# Patient Record
Sex: Female | Born: 1950 | Race: Black or African American | Hispanic: No | Marital: Single | Smoking: Current every day smoker
Health system: Southern US, Community
[De-identification: ages and names within clinical notes are randomized; demographics above are authoritative.]

## PROBLEM LIST (undated history)

## (undated) DIAGNOSIS — I10 Essential (primary) hypertension: Secondary | ICD-10-CM

## (undated) DIAGNOSIS — E7800 Pure hypercholesterolemia, unspecified: Secondary | ICD-10-CM

## (undated) DIAGNOSIS — J449 Chronic obstructive pulmonary disease, unspecified: Secondary | ICD-10-CM

## (undated) DIAGNOSIS — K219 Gastro-esophageal reflux disease without esophagitis: Secondary | ICD-10-CM

## (undated) DIAGNOSIS — I219 Acute myocardial infarction, unspecified: Secondary | ICD-10-CM

## (undated) DIAGNOSIS — M1990 Unspecified osteoarthritis, unspecified site: Secondary | ICD-10-CM

## (undated) DIAGNOSIS — I2510 Atherosclerotic heart disease of native coronary artery without angina pectoris: Secondary | ICD-10-CM

## (undated) HISTORY — PX: CARDIAC DEFIBRILLATOR PLACEMENT: SHX171

## (undated) HISTORY — PX: CARDIAC SURGERY: SHX584

## (undated) HISTORY — PX: FEMUR FRACTURE SURGERY: SHX633

## (undated) HISTORY — PX: CORONARY ARTERY BYPASS GRAFT: SHX141

## (undated) HISTORY — PX: ABDOMINAL HYSTERECTOMY: SHX81

## (undated) HISTORY — PX: VALVE REPLACEMENT: SUR13

---

## 2005-03-08 ENCOUNTER — Emergency Department (HOSPITAL_COMMUNITY): Admission: EM | Admit: 2005-03-08 | Discharge: 2005-03-08 | Payer: Self-pay | Admitting: Emergency Medicine

## 2005-11-22 ENCOUNTER — Emergency Department (HOSPITAL_COMMUNITY): Admission: EM | Admit: 2005-11-22 | Discharge: 2005-11-22 | Payer: Self-pay | Admitting: Emergency Medicine

## 2005-12-19 ENCOUNTER — Emergency Department (HOSPITAL_COMMUNITY): Admission: EM | Admit: 2005-12-19 | Discharge: 2005-12-19 | Payer: Self-pay | Admitting: Emergency Medicine

## 2006-01-08 ENCOUNTER — Emergency Department (HOSPITAL_COMMUNITY): Admission: EM | Admit: 2006-01-08 | Discharge: 2006-01-08 | Payer: Self-pay | Admitting: Emergency Medicine

## 2006-03-20 ENCOUNTER — Emergency Department (HOSPITAL_COMMUNITY): Admission: EM | Admit: 2006-03-20 | Discharge: 2006-03-20 | Payer: Self-pay | Admitting: Emergency Medicine

## 2011-10-24 ENCOUNTER — Emergency Department (HOSPITAL_COMMUNITY)
Admission: EM | Admit: 2011-10-24 | Discharge: 2011-10-24 | Disposition: A | Discharge status: Home / Self Care | Payer: Self-pay | Attending: Emergency Medicine | Admitting: Emergency Medicine

## 2011-10-24 ENCOUNTER — Encounter (HOSPITAL_COMMUNITY): Payer: Self-pay | Admitting: Emergency Medicine

## 2011-10-24 DIAGNOSIS — Z79899 Other long term (current) drug therapy: Secondary | ICD-10-CM | POA: Insufficient documentation

## 2011-10-24 DIAGNOSIS — I10 Essential (primary) hypertension: Secondary | ICD-10-CM | POA: Insufficient documentation

## 2011-10-24 DIAGNOSIS — I2510 Atherosclerotic heart disease of native coronary artery without angina pectoris: Secondary | ICD-10-CM | POA: Insufficient documentation

## 2011-10-24 DIAGNOSIS — K219 Gastro-esophageal reflux disease without esophagitis: Secondary | ICD-10-CM | POA: Insufficient documentation

## 2011-10-24 DIAGNOSIS — E7800 Pure hypercholesterolemia, unspecified: Secondary | ICD-10-CM | POA: Insufficient documentation

## 2011-10-24 DIAGNOSIS — F1110 Opioid abuse, uncomplicated: Secondary | ICD-10-CM

## 2011-10-24 DIAGNOSIS — Z7982 Long term (current) use of aspirin: Secondary | ICD-10-CM | POA: Insufficient documentation

## 2011-10-24 DIAGNOSIS — F1120 Opioid dependence, uncomplicated: Secondary | ICD-10-CM | POA: Insufficient documentation

## 2011-10-24 HISTORY — DX: Essential (primary) hypertension: I10

## 2011-10-24 HISTORY — DX: Pure hypercholesterolemia, unspecified: E78.00

## 2011-10-24 HISTORY — DX: Gastro-esophageal reflux disease without esophagitis: K21.9

## 2011-10-24 HISTORY — DX: Atherosclerotic heart disease of native coronary artery without angina pectoris: I25.10

## 2011-10-24 LAB — COMPREHENSIVE METABOLIC PANEL
BUN: 16 mg/dL (ref 6–23)
CO2: 24 mEq/L (ref 19–32)
Chloride: 105 mEq/L (ref 96–112)
Creatinine, Ser: 0.86 mg/dL (ref 0.50–1.10)
GFR calc Af Amer: 87 mL/min — ABNORMAL LOW (ref >90)
GFR calc non Af Amer: 75 mL/min — ABNORMAL LOW (ref >90)
Glucose, Bld: 85 mg/dL (ref 70–99)
Potassium: 3.8 mEq/L (ref 3.5–5.1)

## 2011-10-24 LAB — RAPID URINE DRUG SCREEN, HOSP PERFORMED
Barbiturates: NOT DETECTED
Cocaine: NOT DETECTED
Opiates: POSITIVE — AB
Tetrahydrocannabinol: NOT DETECTED

## 2011-10-24 LAB — CBC
HCT: 35.9 % — ABNORMAL LOW (ref 36.0–46.0)
MCV: 75.7 fL — ABNORMAL LOW (ref 78.0–100.0)
RDW: 16.8 % — ABNORMAL HIGH (ref 11.5–15.5)

## 2011-10-24 MED ORDER — PROMETHAZINE HCL 25 MG PO TABS: 25.0000 mg | ORAL_TABLET | Freq: Four times a day (QID) | ORAL | Status: AC | PRN

## 2011-10-24 MED ORDER — ZOLPIDEM TARTRATE 5 MG PO TABS: 5.0000 mg | ORAL_TABLET | Freq: Every evening | ORAL | Status: DC | PRN

## 2011-10-24 MED ORDER — CLONIDINE HCL 0.2 MG PO TABS: 0.1000 mg | ORAL_TABLET | Freq: Three times a day (TID) | ORAL | Status: AC | PRN

## 2011-10-24 NOTE — ED Notes (Signed)
Denies SI or HI 

## 2011-10-24 NOTE — ED Notes (Signed)
Patient waiting discharge papers. Patient resting with NAD at this time.

## 2011-10-24 NOTE — ED Notes (Signed)
Here for detox from pain killers last used this am

## 2011-10-24 NOTE — ED Provider Notes (Signed)
History     CSN: [id number]  Arrival date & time 10/24/11  1459   First MD Initiated Contact with Patient 10/24/11 1717      Chief Complaint  Patient presents with  . Medical Clearance    (Consider location/radiation/quality/duration/timing/severity/associated sxs/prior treatment) The history is provided by the patient.   the patient requests detox from her Vicodin.  She reports she's been using Vicodin for approximately one year now.  He was initially given to her for pain control.  She reports she takes about 4 Vicodin a day and she no longer likes using it.  She reports she tried to come off for one time but it made her feel achy and "miserable".  Patient denies homicidal or suicidal ideation.  Does request help with detox from her Vicodin.  Past Medical History  Diagnosis Date  . Coronary artery disease   . Hypertension   . High cholesterol   . Acid reflux     Past Surgical History  Procedure Date  . Cardiac surgery   . Coronary artery bypass graft     No family history on file.  History  Substance Use Topics  . Smoking status: Current Everyday Smoker  . Smokeless tobacco: Not on file  . Alcohol Use: No    OB History    Grav Para Term Preterm Abortions TAB SAB Ect Mult Living                  Review of Systems  All other systems reviewed and are negative.    Allergies  Codeine  Home Medications   Current Outpatient Rx  Name Route Sig Dispense Refill  . ASPIRIN EC 81 MG PO TBEC Oral Take 81 mg by mouth daily.    Marland Kitchen CARVEDILOL 12.5 MG PO TABS Oral Take 6.25-12.5 mg by mouth 2 (two) times daily with a meal. Patient takes 1 tablet in the morning and 1/2 tablet in the evening    . EZETIMIBE 10 MG PO TABS Oral Take 10 mg by mouth daily.    . OMEGA-3 FATTY ACIDS 1000 MG PO CAPS Oral Take 1 g by mouth daily.    . ISOSORBIDE MONONITRATE ER 30 MG PO TB24 Oral Take 30 mg by mouth daily.    . ADULT MULTIVITAMIN W/MINERALS CH Oral Take 1 tablet by mouth daily.      Marland Kitchen OLMESARTAN MEDOXOMIL-HCTZ 40-25 MG PO TABS Oral Take 1 tablet by mouth daily.    Marland Kitchen PRASUGREL HCL 10 MG PO TABS Oral Take 10 mg by mouth daily.    Marland Kitchen RABEPRAZOLE SODIUM 20 MG PO TBEC Oral Take 20 mg by mouth daily.    Marland Kitchen ROSUVASTATIN CALCIUM 20 MG PO TABS Oral Take 20 mg by mouth daily.      BP 173/88  Pulse 82  Temp(Src) 98.6 F (37 C) (Oral)  Resp 18  SpO2 99%  Physical Exam  Nursing note and vitals reviewed. Constitutional: She is oriented to person, place, and time. She appears well-developed and well-nourished. No distress.  HENT:  Head: Normocephalic and atraumatic.  Eyes: EOM are normal.  Neck: Normal range of motion.  Cardiovascular: Normal rate, regular rhythm and normal heart sounds.   Pulmonary/Chest: Effort normal and breath sounds normal.  Abdominal: Soft. She exhibits no distension. There is no tenderness.  Musculoskeletal: Normal range of motion.  Neurological: She is alert and oriented to person, place, and time.  Skin: Skin is warm and dry.  Psychiatric: She has a normal mood and  affect. Judgment normal.    ED Course  Procedures (including critical care time)  Labs Reviewed  CBC - Abnormal; Notable for the following:    WBC 12.4 (*)    HCT 35.9 (*)    MCV 75.7 (*)    MCH 25.3 (*)    RDW 16.8 (*)    All other components within normal limits  COMPREHENSIVE METABOLIC PANEL - Abnormal; Notable for the following:    Albumin 3.4 (*)    Total Bilirubin 0.2 (*)    GFR calc non Af Amer 75 (*)    GFR calc Af Amer 87 (*)    All other components within normal limits  URINE RAPID DRUG SCREEN (HOSP PERFORMED) - Abnormal; Notable for the following:    Opiates POSITIVE (*)    All other components within normal limits  ETHANOL   No results found.   1. Narcotic Abuse    MDM  She only uses four Vicodin daily. Will tx as outpatient with referrel to NA. Home with phenergan, clonidine and ambien.         Lyanne Co, MD 10/24/11 [phone]

## 2014-08-22 ENCOUNTER — Emergency Department (HOSPITAL_BASED_OUTPATIENT_CLINIC_OR_DEPARTMENT_OTHER)
Admission: EM | Admit: 2014-08-22 | Discharge: 2014-08-22 | Disposition: A | Discharge status: Home / Self Care | Payer: Medicaid Other | Attending: Emergency Medicine | Admitting: Emergency Medicine

## 2014-08-22 ENCOUNTER — Encounter (HOSPITAL_BASED_OUTPATIENT_CLINIC_OR_DEPARTMENT_OTHER): Payer: Self-pay | Admitting: *Deleted

## 2014-08-22 DIAGNOSIS — Z79899 Other long term (current) drug therapy: Secondary | ICD-10-CM | POA: Diagnosis not present

## 2014-08-22 DIAGNOSIS — Y998 Other external cause status: Secondary | ICD-10-CM | POA: Insufficient documentation

## 2014-08-22 DIAGNOSIS — Y9289 Other specified places as the place of occurrence of the external cause: Secondary | ICD-10-CM | POA: Insufficient documentation

## 2014-08-22 DIAGNOSIS — K219 Gastro-esophageal reflux disease without esophagitis: Secondary | ICD-10-CM | POA: Insufficient documentation

## 2014-08-22 DIAGNOSIS — S29012A Strain of muscle and tendon of back wall of thorax, initial encounter: Secondary | ICD-10-CM | POA: Insufficient documentation

## 2014-08-22 DIAGNOSIS — Z955 Presence of coronary angioplasty implant and graft: Secondary | ICD-10-CM | POA: Diagnosis not present

## 2014-08-22 DIAGNOSIS — Z7902 Long term (current) use of antithrombotics/antiplatelets: Secondary | ICD-10-CM | POA: Diagnosis not present

## 2014-08-22 DIAGNOSIS — S46819A Strain of other muscles, fascia and tendons at shoulder and upper arm level, unspecified arm, initial encounter: Secondary | ICD-10-CM

## 2014-08-22 DIAGNOSIS — Y9389 Activity, other specified: Secondary | ICD-10-CM | POA: Insufficient documentation

## 2014-08-22 DIAGNOSIS — E780 Pure hypercholesterolemia: Secondary | ICD-10-CM | POA: Insufficient documentation

## 2014-08-22 DIAGNOSIS — Z720 Tobacco use: Secondary | ICD-10-CM | POA: Insufficient documentation

## 2014-08-22 DIAGNOSIS — Z9889 Other specified postprocedural states: Secondary | ICD-10-CM | POA: Insufficient documentation

## 2014-08-22 DIAGNOSIS — Z7982 Long term (current) use of aspirin: Secondary | ICD-10-CM | POA: Diagnosis not present

## 2014-08-22 DIAGNOSIS — M1990 Unspecified osteoarthritis, unspecified site: Secondary | ICD-10-CM | POA: Insufficient documentation

## 2014-08-22 DIAGNOSIS — I10 Essential (primary) hypertension: Secondary | ICD-10-CM | POA: Insufficient documentation

## 2014-08-22 DIAGNOSIS — I2510 Atherosclerotic heart disease of native coronary artery without angina pectoris: Secondary | ICD-10-CM | POA: Insufficient documentation

## 2014-08-22 DIAGNOSIS — W109XXA Fall (on) (from) unspecified stairs and steps, initial encounter: Secondary | ICD-10-CM | POA: Insufficient documentation

## 2014-08-22 DIAGNOSIS — M546 Pain in thoracic spine: Secondary | ICD-10-CM | POA: Diagnosis present

## 2014-08-22 DIAGNOSIS — Z87828 Personal history of other (healed) physical injury and trauma: Secondary | ICD-10-CM | POA: Diagnosis not present

## 2014-08-22 HISTORY — DX: Acute myocardial infarction, unspecified: I21.9

## 2014-08-22 HISTORY — DX: Unspecified osteoarthritis, unspecified site: M19.90

## 2014-08-22 MED ORDER — CYCLOBENZAPRINE HCL 10 MG PO TABS: 10.0000 mg | ORAL_TABLET | Freq: Two times a day (BID) | ORAL | Status: DC | PRN

## 2014-08-22 MED ORDER — HYDROCODONE-ACETAMINOPHEN 5-325 MG PO TABS: 1.0000 | ORAL_TABLET | Freq: Four times a day (QID) | ORAL | Status: DC | PRN

## 2014-08-22 NOTE — ED Provider Notes (Signed)
CSN: [id number]     Arrival date & time 08/22/14  1011 History   First MD Initiated Contact with Patient 08/22/14 1058     Chief Complaint  Patient presents with  . Back Pain     (Consider location/radiation/quality/duration/timing/severity/associated sxs/prior Treatment) Patient is a 63 y.o. female presenting with back pain. The history is provided by the patient.  Back Pain Location:  Thoracic spine Quality:  Aching and cramping Radiates to:  Does not radiate Pain severity:  Moderate Onset quality:  Gradual Duration:  2 days Timing:  Constant Progression:  Worsening Chronicity:  New Context: falling   Context comment:  She was coming out of her house and slipped on the steps and broke her fall by grabbing a handrail. Since that time she has developed pain in the bilateral trapezius muscles and pain in her triceps Relieved by:  Nothing Worsened by:  Movement Ineffective treatments:  Ibuprofen Associated symptoms: no abdominal pain, no bladder incontinence, no bowel incontinence, no chest pain, no headaches, no leg pain, no numbness, no paresthesias and no weakness   Risk factors: no hx of osteoporosis     Past Medical History  Diagnosis Date  . Coronary artery disease   . Hypertension   . High cholesterol   . Acid reflux   . Heart attack   . Arthritis    Past Surgical History  Procedure Laterality Date  . Cardiac surgery    . Coronary artery bypass graft    . Abdominal hysterectomy    . Femur fracture surgery     No family history on file. History  Substance Use Topics  . Smoking status: Current Every Day Smoker    Types: Cigarettes  . Smokeless tobacco: Never Used  . Alcohol Use: No   OB History    No data available     Review of Systems  Cardiovascular: Negative for chest pain.  Gastrointestinal: Negative for abdominal pain and bowel incontinence.  Genitourinary: Negative for bladder incontinence.  Musculoskeletal: Positive for back pain.    Neurological: Negative for weakness, numbness, headaches and paresthesias.  All other systems reviewed and are negative.     Allergies  Codeine  Home Medications   Prior to Admission medications   Medication Sig Start Date End Date Taking? Authorizing Provider  aspirin EC 81 MG tablet Take 81 mg by mouth daily.   Yes Historical Provider, MD  atorvastatin (LIPITOR) 80 MG tablet Take 80 mg by mouth daily.   Yes Historical Provider, MD  carvedilol (COREG) 12.5 MG tablet Take 6.25-12.5 mg by mouth 2 (two) times daily with a meal. Patient takes 1 tablet in the morning and 1/2 tablet in the evening   Yes Historical Provider, MD  fish oil-omega-3 fatty acids 1000 MG capsule Take 1 g by mouth daily.   Yes Historical Provider, MD  Multiple Vitamin (MULITIVITAMIN WITH MINERALS) TABS Take 1 tablet by mouth daily.   Yes Historical Provider, MD  pantoprazole (PROTONIX) 20 MG tablet Take 20 mg by mouth daily.   Yes Historical Provider, MD  cloNIDine (CATAPRES) 0.2 MG tablet Take 0.5 tablets (0.1 mg total) by mouth 3 (three) times daily as needed (withdrawl symptoms). 10/24/11 10/23/12  Lyanne CoKevin M Campos, MD  cyclobenzaprine (FLEXERIL) 10 MG tablet Take 1 tablet (10 mg total) by mouth 2 (two) times daily as needed for muscle spasms. 08/22/14   Gwyneth SproutWhitney Leeona Mccardle, MD  ezetimibe (ZETIA) 10 MG tablet Take 10 mg by mouth daily.    Historical Provider, MD  HYDROcodone-acetaminophen (NORCO/VICODIN) 5-325 MG per tablet Take 1-2 tablets by mouth every 6 (six) hours as needed for moderate pain or severe pain. 08/22/14   Gwyneth SproutWhitney Ramal Eckhardt, MD  isosorbide mononitrate (IMDUR) 30 MG 24 hr tablet Take 30 mg by mouth daily.    Historical Provider, MD  olmesartan-hydrochlorothiazide (BENICAR HCT) 40-25 MG per tablet Take 1 tablet by mouth daily.    Historical Provider, MD  prasugrel (EFFIENT) 10 MG TABS Take 10 mg by mouth daily.    Historical Provider, MD  RABEprazole (ACIPHEX) 20 MG tablet Take 20 mg by mouth daily.     Historical Provider, MD  rosuvastatin (CRESTOR) 20 MG tablet Take 20 mg by mouth daily.    Historical Provider, MD  zolpidem (AMBIEN) 5 MG tablet Take 1 tablet (5 mg total) by mouth at bedtime as needed for sleep. 10/24/11 10/23/12  Lyanne CoKevin M Campos, MD   BP 151/77 mmHg  Pulse 68  Temp(Src) 98 F (36.7 C) (Oral)  Resp 14  Ht 5\' 5"  (1.651 m)  Wt 147 lb (66.679 kg)  BMI 24.46 kg/m2  SpO2 99% Physical Exam  Constitutional: She is oriented to person, place, and time. She appears well-developed and well-nourished. No distress.  HENT:  Head: Normocephalic and atraumatic.  Cardiovascular: Normal rate and intact distal pulses.   Pulmonary/Chest: Effort normal.  Musculoskeletal:       Cervical back: She exhibits tenderness, pain and spasm.       Back:  Bilateral trapezius spasm  Neurological: She is alert and oriented to person, place, and time.  Skin: Skin is warm and dry.  Psychiatric: She has a normal mood and affect. Her behavior is normal.  Nursing note and vitals reviewed.   ED Course  Procedures (including critical care time) Labs Review Labs Reviewed - No data to display  Imaging Review No results found.   EKG Interpretation None      MDM   Final diagnoses:  Trapezius strain, unspecified laterality, initial encounter    Patient with evidence of trapezial spasm and strain after she fell 2 days ago. She grabbed the rail which jerked her arms to stop her fall. Which is most likely why she's having this pain. She is neurovascularly intact she has no midline tenderness requiring x-rays and she is otherwise able to ambulate without difficulty. Patient given symptomatic care and follow-up with PCP    Gwyneth SproutWhitney Harlan Vinal, MD 08/22/14 1118

## 2014-08-22 NOTE — ED Notes (Signed)
rx x 2 given for flexeril and vicodin- d/c home with ride

## 2014-08-22 NOTE — ED Notes (Signed)
States she lost balance on Thursday while going down 2 steps and landed on her bottom- grabbed rail to control fall- c/o back pain and back of both arms is sore-

## 2015-09-18 ENCOUNTER — Encounter (HOSPITAL_BASED_OUTPATIENT_CLINIC_OR_DEPARTMENT_OTHER): Payer: Self-pay | Admitting: Emergency Medicine

## 2015-09-18 ENCOUNTER — Emergency Department (HOSPITAL_BASED_OUTPATIENT_CLINIC_OR_DEPARTMENT_OTHER)
Admission: EM | Admit: 2015-09-18 | Discharge: 2015-09-18 | Disposition: A | Discharge status: Home / Self Care | Payer: Medicare Other | Attending: Emergency Medicine | Admitting: Emergency Medicine

## 2015-09-18 ENCOUNTER — Emergency Department (HOSPITAL_BASED_OUTPATIENT_CLINIC_OR_DEPARTMENT_OTHER): Payer: Medicare Other

## 2015-09-18 DIAGNOSIS — Z7982 Long term (current) use of aspirin: Secondary | ICD-10-CM | POA: Diagnosis not present

## 2015-09-18 DIAGNOSIS — E7800 Pure hypercholesterolemia, unspecified: Secondary | ICD-10-CM | POA: Diagnosis not present

## 2015-09-18 DIAGNOSIS — Z79899 Other long term (current) drug therapy: Secondary | ICD-10-CM | POA: Insufficient documentation

## 2015-09-18 DIAGNOSIS — Z951 Presence of aortocoronary bypass graft: Secondary | ICD-10-CM | POA: Diagnosis not present

## 2015-09-18 DIAGNOSIS — Z7901 Long term (current) use of anticoagulants: Secondary | ICD-10-CM | POA: Diagnosis not present

## 2015-09-18 DIAGNOSIS — K219 Gastro-esophageal reflux disease without esophagitis: Secondary | ICD-10-CM | POA: Insufficient documentation

## 2015-09-18 DIAGNOSIS — I2510 Atherosclerotic heart disease of native coronary artery without angina pectoris: Secondary | ICD-10-CM | POA: Diagnosis not present

## 2015-09-18 DIAGNOSIS — J40 Bronchitis, not specified as acute or chronic: Secondary | ICD-10-CM

## 2015-09-18 DIAGNOSIS — I509 Heart failure, unspecified: Secondary | ICD-10-CM | POA: Diagnosis not present

## 2015-09-18 DIAGNOSIS — I10 Essential (primary) hypertension: Secondary | ICD-10-CM | POA: Diagnosis not present

## 2015-09-18 DIAGNOSIS — I252 Old myocardial infarction: Secondary | ICD-10-CM | POA: Insufficient documentation

## 2015-09-18 DIAGNOSIS — R059 Cough, unspecified: Secondary | ICD-10-CM

## 2015-09-18 DIAGNOSIS — F17210 Nicotine dependence, cigarettes, uncomplicated: Secondary | ICD-10-CM | POA: Diagnosis not present

## 2015-09-18 DIAGNOSIS — M1990 Unspecified osteoarthritis, unspecified site: Secondary | ICD-10-CM | POA: Diagnosis not present

## 2015-09-18 DIAGNOSIS — J209 Acute bronchitis, unspecified: Secondary | ICD-10-CM | POA: Diagnosis not present

## 2015-09-18 DIAGNOSIS — R05 Cough: Secondary | ICD-10-CM

## 2015-09-18 LAB — PROTIME-INR
INR: 1.95 — ABNORMAL HIGH (ref 0.00–1.49)
Prothrombin Time: 22.1 seconds — ABNORMAL HIGH (ref 11.6–15.2)

## 2015-09-18 MED ORDER — AMOXICILLIN-POT CLAVULANATE 875-125 MG PO TABS: 1.0000 | ORAL_TABLET | Freq: Two times a day (BID) | ORAL | Status: DC

## 2015-09-18 MED ORDER — FUROSEMIDE 10 MG/ML IJ SOLN
40.0000 mg | Freq: Once | INTRAMUSCULAR | Status: AC
  Administered 2015-09-18: 40 mg via INTRAMUSCULAR
  Filled 2015-09-18: qty 4

## 2015-09-18 NOTE — ED Provider Notes (Signed)
CSN: [id number]     Arrival date & time 09/18/15  1631 History   By signing my name below, I, Terrance Branch, attest that this documentation has been prepared under the direction and in the presence of Rolan Bucco, MD. Electronically Signed: Evon Slack, ED Scribe. 09/18/2015. 7:08 PM.    Chief Complaint  Patient presents with  . Cough   The history is provided by the patient. No language interpreter was used.   HPI Comments: Lisa Arias is a 64 y.o. female who presents to the Emergency Department complaining of productive cough onset 2 weeks prior. Pt states that she is coughing up yellow sputum. Pt reports congestion and rhinorrhea. She doesn't report any medications PTA. Pt doesn't report any alleviating factors. Pt denies fever, n/v, CP or leg swelling. Pt does report that she is on an anticoagulant(coumadin). She does have some mild shortness of breath with coughing only. She has no exertional shortness of breath. There is no orthopnea. She denies any chest pain or tightness.   Past Medical History  Diagnosis Date  . Coronary artery disease   . Hypertension   . High cholesterol   . Acid reflux   . Heart attack (HCC)   . Arthritis    Past Surgical History  Procedure Laterality Date  . Cardiac surgery    . Coronary artery bypass graft    . Abdominal hysterectomy    . Femur fracture surgery     History reviewed. No pertinent family history. Social History  Substance Use Topics  . Smoking status: Current Every Day Smoker    Types: Cigarettes  . Smokeless tobacco: Never Used  . Alcohol Use: No   OB History    No data available      Review of Systems  Constitutional: Negative for fever, chills, diaphoresis and fatigue.  HENT: Positive for congestion. Negative for rhinorrhea and sneezing.   Eyes: Negative.   Respiratory: Positive for cough. Negative for chest tightness and shortness of breath.   Cardiovascular: Negative for chest pain and leg swelling.   Gastrointestinal: Negative for nausea, vomiting, abdominal pain, diarrhea and blood in stool.  Genitourinary: Negative for frequency, hematuria, flank pain and difficulty urinating.  Musculoskeletal: Negative for back pain and arthralgias.  Skin: Negative for rash.  Neurological: Negative for dizziness, speech difficulty, weakness, numbness and headaches.      Allergies  Codeine  Home Medications   Prior to Admission medications   Medication Sig Start Date End Date Taking? Authorizing Provider  warfarin (COUMADIN) 10 MG tablet Take 10 mg by mouth daily. tue and sat 10 mg, other days 5 mg   Yes Historical Provider, MD  amoxicillin-clavulanate (AUGMENTIN) 875-125 MG tablet Take 1 tablet by mouth 2 (two) times daily. One po bid x 7 days 09/18/15   Rolan Bucco, MD  aspirin EC 81 MG tablet Take 81 mg by mouth daily.    Historical Provider, MD  atorvastatin (LIPITOR) 80 MG tablet Take 80 mg by mouth daily.    Historical Provider, MD  carvedilol (COREG) 12.5 MG tablet Take 6.25-12.5 mg by mouth 2 (two) times daily with a meal. Patient takes 1 tablet in the morning and 1/2 tablet in the evening    Historical Provider, MD  cloNIDine (CATAPRES) 0.2 MG tablet Take 0.5 tablets (0.1 mg total) by mouth 3 (three) times daily as needed (withdrawl symptoms). 10/24/11 10/23/12  Azalia Bilis, MD  cyclobenzaprine (FLEXERIL) 10 MG tablet Take 1 tablet (10 mg total) by mouth 2 (two) times  daily as needed for muscle spasms. 08/22/14   Gwyneth Sprout, MD  ezetimibe (ZETIA) 10 MG tablet Take 10 mg by mouth daily.    Historical Provider, MD  fish oil-omega-3 fatty acids 1000 MG capsule Take 1 g by mouth daily.    Historical Provider, MD  HYDROcodone-acetaminophen (NORCO/VICODIN) 5-325 MG per tablet Take 1-2 tablets by mouth every 6 (six) hours as needed for moderate pain or severe pain. 08/22/14   Gwyneth Sprout, MD  isosorbide mononitrate (IMDUR) 30 MG 24 hr tablet Take 30 mg by mouth daily.    Historical  Provider, MD  Multiple Vitamin (MULITIVITAMIN WITH MINERALS) TABS Take 1 tablet by mouth daily.    Historical Provider, MD  olmesartan-hydrochlorothiazide (BENICAR HCT) 40-25 MG per tablet Take 1 tablet by mouth daily.    Historical Provider, MD  pantoprazole (PROTONIX) 20 MG tablet Take 20 mg by mouth daily.    Historical Provider, MD  prasugrel (EFFIENT) 10 MG TABS Take 10 mg by mouth daily.    Historical Provider, MD  RABEprazole (ACIPHEX) 20 MG tablet Take 20 mg by mouth daily.    Historical Provider, MD  rosuvastatin (CRESTOR) 20 MG tablet Take 20 mg by mouth daily.    Historical Provider, MD  zolpidem (AMBIEN) 5 MG tablet Take 1 tablet (5 mg total) by mouth at bedtime as needed for sleep. 10/24/11 10/23/12  Azalia Bilis, MD   BP 136/61 mmHg  Pulse 64  Temp(Src) 98.4 F (36.9 C) (Oral)  Resp 24  Ht  (1.651 m)  Wt 164 lb (74.39 kg)  BMI 27.29 kg/m2  SpO2 93%   Physical Exam  Constitutional: She is oriented to person, place, and time. She appears well-developed and well-nourished.  HENT:  Head: Normocephalic and atraumatic.  Eyes: Pupils are equal, round, and reactive to light.  Neck: Normal range of motion. Neck supple.  Cardiovascular: Normal rate, regular rhythm and normal heart sounds.   Click with mechanical heart valve.   Pulmonary/Chest: Effort normal and breath sounds normal. No respiratory distress. She has no wheezes. She has no rales. She exhibits no tenderness.  Abdominal: Soft. Bowel sounds are normal. There is no tenderness. There is no rebound and no guarding.  Musculoskeletal: Normal range of motion. She exhibits edema.  Trace edema bilaterally.   Lymphadenopathy:    She has no cervical adenopathy.  Neurological: She is alert and oriented to person, place, and time.  Skin: Skin is warm and dry. No rash noted.  Psychiatric: She has a normal mood and affect.    ED Course  Procedures (including critical care time) DIAGNOSTIC STUDIES: Oxygen Saturation is  93% on RA, adequate by my interpretation.    COORDINATION OF CARE: 6:02 PM-Discussed treatment plan with pt at bedside and pt agreed to plan.     Labs Review Labs Reviewed  PROTIME-INR - Abnormal; Notable for the following:    Prothrombin Time 22.1 (*)    INR 1.95 (*)    All other components within normal limits    Imaging Review Dg Chest 2 View  09/18/2015  CLINICAL DATA:  Cough, congestion for 2 weeks, productive cough. History of heart valve surgery and stent placement. EXAM: CHEST  2 VIEW COMPARISON:  None. FINDINGS: There is mild cardiomegaly. Median sternotomy wires appear intact and normally aligned. Valve replacement hardware in place. There is evidence of mild central pulmonary vascular congestion and mild bilateral interstitial edema. No overt alveolar pulmonary edema. Small pleural effusion versus atelectasis at the left lung base.  No large pleural effusion. No pneumothorax seen. No acute osseous abnormality. IMPRESSION: Mild cardiomegaly with central pulmonary vascular congestion and mild interstitial edema suggesting mild CHF/volume overload. No overt alveolar pulmonary edema seen. Probable small left pleural effusion and/or atelectasis. No evidence of pneumonia seen. Electronically Signed   By: Bary RichardStan  Maynard M.D.   On: 09/18/2015 17:33      EKG Interpretation None      MDM   Final diagnoses:  Bronchitis   Patient presents with cough productive of yellow sputum. It's been going on for 2 weeks. She states she initially thought it was getting a little bit better but over the last week it's been progressively getting worse. She's afebrile. However given the fact this been going on for 2 weeks and worsening, I did feel that she needs to be treated with antibiotics for possible bacterial bronchitis. Her chest x-ray does not demonstrate pneumonia. She has some mild CHF.  She does have a history of CHF and is on torosemide.  She doesn't have any clinical symptoms of worsening  CHF. She has no exertional shortness of breath. No chest pain. No increased leg swelling. However given her x-ray findings, I did give her a dose of Lasix here in the ED. I don't feel that at this point she needs further cardiac evaluation in the ED. Her current symptoms sound more consistent with a bronchitis. She is maintaining normal oxygen saturations. She was discharged home in good condition. She was started on Augmentin, as this seems to have less effect than other antibiotics on her INR. Her INR is a little subtherapeutic currently. She has an appointment this week to have her INR rechecked. She states it was a little bit too high last week and she had missed a couple doses. She was advised to follow-up with her family physician within the next few days or return here as needed for any worsening symptoms.   I personally performed the services described in this documentation, which was scribed in my presence.  The recorded information has been reviewed and considered.      Rolan BuccoMelanie Biridiana Twardowski, MD 09/18/15 [phone]

## 2015-09-18 NOTE — ED Notes (Signed)
Patient states that she has the "guck". Coughing up yellow thick mucus. The patient states that is had been going on for 2 weeks.

## 2015-09-18 NOTE — Discharge Instructions (Signed)

## 2017-07-07 ENCOUNTER — Emergency Department (HOSPITAL_BASED_OUTPATIENT_CLINIC_OR_DEPARTMENT_OTHER): Payer: Medicare Other

## 2017-07-07 ENCOUNTER — Encounter (HOSPITAL_BASED_OUTPATIENT_CLINIC_OR_DEPARTMENT_OTHER): Payer: Self-pay | Admitting: *Deleted

## 2017-07-07 ENCOUNTER — Emergency Department (HOSPITAL_BASED_OUTPATIENT_CLINIC_OR_DEPARTMENT_OTHER)
Admission: EM | Admit: 2017-07-07 | Discharge: 2017-07-07 | Disposition: A | Discharge status: Home / Self Care | Payer: Medicare Other | Attending: Emergency Medicine | Admitting: Emergency Medicine

## 2017-07-07 DIAGNOSIS — I509 Heart failure, unspecified: Secondary | ICD-10-CM | POA: Diagnosis not present

## 2017-07-07 DIAGNOSIS — I110 Hypertensive heart disease with heart failure: Secondary | ICD-10-CM | POA: Insufficient documentation

## 2017-07-07 DIAGNOSIS — J449 Chronic obstructive pulmonary disease, unspecified: Secondary | ICD-10-CM | POA: Insufficient documentation

## 2017-07-07 DIAGNOSIS — I2510 Atherosclerotic heart disease of native coronary artery without angina pectoris: Secondary | ICD-10-CM | POA: Insufficient documentation

## 2017-07-07 DIAGNOSIS — Z951 Presence of aortocoronary bypass graft: Secondary | ICD-10-CM | POA: Insufficient documentation

## 2017-07-07 DIAGNOSIS — Z7901 Long term (current) use of anticoagulants: Secondary | ICD-10-CM | POA: Insufficient documentation

## 2017-07-07 DIAGNOSIS — F17210 Nicotine dependence, cigarettes, uncomplicated: Secondary | ICD-10-CM | POA: Insufficient documentation

## 2017-07-07 DIAGNOSIS — Z95810 Presence of automatic (implantable) cardiac defibrillator: Secondary | ICD-10-CM | POA: Diagnosis not present

## 2017-07-07 DIAGNOSIS — R059 Cough, unspecified: Secondary | ICD-10-CM

## 2017-07-07 DIAGNOSIS — Z7982 Long term (current) use of aspirin: Secondary | ICD-10-CM | POA: Diagnosis not present

## 2017-07-07 DIAGNOSIS — R05 Cough: Secondary | ICD-10-CM | POA: Diagnosis present

## 2017-07-07 DIAGNOSIS — Z79899 Other long term (current) drug therapy: Secondary | ICD-10-CM | POA: Insufficient documentation

## 2017-07-07 HISTORY — DX: Chronic obstructive pulmonary disease, unspecified: J44.9

## 2017-07-07 MED ORDER — DEXTROMETHORPHAN-GUAIFENESIN 5-100 MG/5ML PO LIQD: 10.0000 mL | Freq: Every evening | ORAL | 0 refills | Status: DC | PRN

## 2017-07-07 MED ORDER — FUROSEMIDE 40 MG PO TABS: 40.0000 mg | ORAL_TABLET | Freq: Once | ORAL | Status: DC

## 2017-07-07 MED ORDER — FLUTICASONE PROPIONATE 50 MCG/ACT NA SUSP: 1.0000 | Freq: Every day | NASAL | 0 refills | Status: DC

## 2017-07-07 NOTE — ED Provider Notes (Signed)
[facility] Provider Note   CSN: [id number] Arrival date & time: 07/07/17  1624     History   Chief Complaint Chief Complaint  Patient presents with  . Cough    HPI Lisa Arias is a 66 y.o. female presenting with 10 day history of cough.  Patient with a past medical history including COPD and CHF presenting with 10 day history of cough. Cough is productive with clear phlegm. It is worse at night. She reports she is unable to sleep due to her cough. She had associated maxillary sinus pressure, but this has resolved. She denies nasal congestion, eye pain, or ear pain. She denies fevers, chills, sore throat, chest pain, worsening shortness of breath, nausea, vomiting, abdominal pain. She reports a friend with similar symptoms. She has not been taking anything to help with her symptoms. She is still on blood thinners, taking it daily. She does not have an inhaler or nebulizer. She denies wheezing. She is on oxygen, 2 L at baseline. She has not had to increase her oxygen recently.   HPI  Past Medical History:  Diagnosis Date  . Acid reflux   . Arthritis   . COPD (chronic obstructive pulmonary disease) (HCC)   . Coronary artery disease   . Heart attack (HCC)   . High cholesterol   . Hypertension     There are no active problems to display for this patient.   Past Surgical History:  Procedure Laterality Date  . ABDOMINAL HYSTERECTOMY    . CARDIAC DEFIBRILLATOR PLACEMENT    . CARDIAC SURGERY    . CORONARY ARTERY BYPASS GRAFT    . FEMUR FRACTURE SURGERY    . VALVE REPLACEMENT      OB History    No data available       Home Medications    Prior to Admission medications   Medication Sig Start Date End Date Taking? Authorizing Provider  amiodarone (PACERONE) 200 MG tablet Take 200 mg by mouth daily.   Yes [provider]  fish oil-omega-3 fatty acids 1000 MG capsule Take 1 g by mouth daily.   Yes [provider]  folic acid (FOLVITE) 1 MG  tablet Take 1 mg by mouth daily.   Yes [provider]  Multiple Vitamin (MULITIVITAMIN WITH MINERALS) TABS Take 1 tablet by mouth daily.   Yes [provider]  pantoprazole (PROTONIX) 20 MG tablet Take 20 mg by mouth daily.   Yes [provider]  pravastatin (PRAVACHOL) 40 MG tablet Take 40 mg by mouth daily.   Yes [provider]  warfarin (COUMADIN) 6 MG tablet Take 6 mg by mouth daily.   Yes [provider]  amoxicillin-clavulanate (AUGMENTIN) 875-125 MG tablet Take 1 tablet by mouth 2 (two) times daily. One po bid x 7 days 09/18/15   Rolan Bucco, MD  aspirin EC 81 MG tablet Take 81 mg by mouth daily.    [provider]  atorvastatin (LIPITOR) 80 MG tablet Take 80 mg by mouth daily.    [provider]  carvedilol (COREG) 12.5 MG tablet Take 6.25-12.5 mg by mouth 2 (two) times daily with a meal. Patient takes 1 tablet in the morning and 1/2 tablet in the evening    [provider]  cloNIDine (CATAPRES) 0.2 MG tablet Take 0.5 tablets (0.1 mg total) by mouth 3 (three) times daily as needed (withdrawl symptoms). 10/24/11 10/23/12  Azalia Bilis, MD  cyclobenzaprine (FLEXERIL) 10 MG tablet Take 1 tablet (10 mg  total) by mouth 2 (two) times daily as needed for muscle spasms. 08/22/14   Gwyneth Sprout, MD  Dextromethorphan-Guaifenesin 5-100 MG/5ML LIQD Take 10 mLs by mouth at bedtime as needed. 07/07/17   Nayara Taplin, PA-C  ezetimibe (ZETIA) 10 MG tablet Take 10 mg by mouth daily.    [provider]  fluticasone (FLONASE) 50 MCG/ACT nasal spray Place 1 spray into both nostrils daily. 07/07/17   Bunnie Lederman, PA-C  HYDROcodone-acetaminophen (NORCO/VICODIN) 5-325 MG per tablet Take 1-2 tablets by mouth every 6 (six) hours as needed for moderate pain or severe pain. 08/22/14   Gwyneth Sprout, MD  isosorbide mononitrate (IMDUR) 30 MG 24 hr tablet Take 30 mg by mouth daily.    [provider]    olmesartan-hydrochlorothiazide (BENICAR HCT) 40-25 MG per tablet Take 1 tablet by mouth daily.    [provider]  prasugrel (EFFIENT) 10 MG TABS Take 10 mg by mouth daily.    [provider]  RABEprazole (ACIPHEX) 20 MG tablet Take 20 mg by mouth daily.    [provider]  rosuvastatin (CRESTOR) 20 MG tablet Take 20 mg by mouth daily.    [provider]  warfarin (COUMADIN) 10 MG tablet Take 10 mg by mouth daily. tue and sat 10 mg, other days 5 mg    [provider]  zolpidem (AMBIEN) 5 MG tablet Take 1 tablet (5 mg total) by mouth at bedtime as needed for sleep. 10/24/11 10/23/12  Azalia Bilis, MD    Family History No family history on file.  Social History Social History  Substance Use Topics  . Smoking status: Current Every Day Smoker    Types: Cigarettes  . Smokeless tobacco: Never Used  . Alcohol use No     Allergies   Codeine   Review of Systems Review of Systems  Constitutional: Negative for chills and fever.  HENT: Positive for sinus pressure (Resolved). Negative for congestion, ear pain, rhinorrhea and sore throat.   Respiratory: Positive for cough. Negative for chest tightness and shortness of breath.   Cardiovascular: Negative for chest pain, palpitations and leg swelling.  Gastrointestinal: Negative for nausea and vomiting.     Physical Exam Updated Vital Signs BP (!) 135/91 (BP Location: Left Arm)   Pulse 88   Temp 97.8 F (36.6 C) (Oral)   Resp 20   Ht  (1.651 m)   Wt 66.2 kg (146 lb)   SpO2 90%   BMI 24.30 kg/m   Physical Exam  Constitutional: She is oriented to person, place, and time. She appears well-developed and well-nourished. No distress.  HENT:  Head: Normocephalic and atraumatic.  Right Ear: Tympanic membrane, external ear and ear canal normal.  Left Ear: Tympanic membrane, external ear and ear canal normal.  Nose: Mucosal edema present. Right sinus exhibits no maxillary sinus tenderness  and no frontal sinus tenderness. Left sinus exhibits no maxillary sinus tenderness and no frontal sinus tenderness.  Mouth/Throat: Uvula is midline, oropharynx is clear and moist and mucous membranes are normal.  Eyes: Pupils are equal, round, and reactive to light. Conjunctivae and EOM are normal.  Neck: Normal range of motion.  Cardiovascular: Normal rate, regular rhythm and intact distal pulses.   Pulmonary/Chest: Effort normal and breath sounds normal. No respiratory distress. She has no decreased breath sounds. She has no wheezes. She has no rhonchi. She has no rales.  Patient speaking in full sentences without difficulty. Lung sounds clear in all fields  Abdominal: Soft. She exhibits  no distension. There is no tenderness.  Musculoskeletal: Normal range of motion.  Lymphadenopathy:    She has no cervical adenopathy.  Neurological: She is alert and oriented to person, place, and time.  Skin: Skin is warm and dry.  Psychiatric: She has a normal mood and affect.  Nursing note and vitals reviewed.    ED Treatments / Results  Labs (all labs ordered are listed, but only abnormal results are displayed) Labs Reviewed - No data to display  EKG  EKG Interpretation None       Radiology Dg Chest 2 View  Result Date: 07/07/2017 CLINICAL DATA:  Cough, congestion for 1.5 weeks, worsening EXAM: CHEST  2 VIEW COMPARISON:  01/25/2017 FINDINGS: Diffuse bilateral interstitial thickening. No significant pleural effusion. No pneumothorax. No focal consolidation. Stable cardiomegaly. Prominence of the central pulmonary vasculature. Prior CABG and aortic valve replacement. 4 lead cardiac pacemaker. No acute osseous abnormality. IMPRESSION: Mild CHF. Electronically Signed   By: Elige Ko   On: 07/07/2017 17:17    Procedures Procedures (including critical care time)  Medications Ordered in ED Medications - No data to display   Initial Impression / Assessment and Plan / ED Course  I have  reviewed the triage vital signs and the nursing notes.  Pertinent labs & imaging results that were available during my care of the patient were reviewed by me and considered in my medical decision making (see chart for details).     Patient presenting with 10 day history of cough, worse at night. No fevers or chills. No increased shortness of breath or chest pain. Physical exam shows patient is afebrile and not tachycardic. Clear lung sounds. Will order chest x-ray due to high risk factors including COPD and CHF. Chest x-ray shows mild CHF. Discussed case with attending, and Dr. Denton Lank evaluated the patient. Recommended basic labs and dose of Lasix for symptom control. Discussed with patient, and patient states she does not want to stay for lab work. She states she needs to pick up a friend from work. Discussed that she may be having an exacerbation of her chronic conditions, and her symptoms could worsen, leading to death. Patient states she understands, but still wants to leave. She states she will follow-up with her cardiologist this week. Additionally, patient states she takes Demadex for CHF. Instructed patient to double dose for the next 2 days. Stressed importance of follow-up with cardiology. Will give Flonase and cough syrup for symptom control. Strict return precautions given. Patient states she understands and agrees to plan.   Final Clinical Impressions(s) / ED Diagnoses   Final diagnoses:  Cough  Congestive heart failure, unspecified HF chronicity, unspecified heart failure type [facility])    New Prescriptions Discharge Medication List as of 07/07/2017  5:57 PM    START taking these medications   Details  Dextromethorphan-Guaifenesin 5-100 MG/5ML LIQD Take 10 mLs by mouth at bedtime as needed., Starting Sat 07/07/2017, Print    fluticasone (FLONASE) 50 MCG/ACT nasal spray Place 1 spray into both nostrils daily., Starting Sat 07/07/2017, Print         Lenox, [place],  PA-C 07/08/17 1610    Cathren Laine, MD 07/08/17 [phone]

## 2017-07-07 NOTE — ED Triage Notes (Signed)
Pt reports cough for >1wk. Denies fever, n/v/d. Hx of COPD.

## 2017-07-07 NOTE — Discharge Instructions (Signed)
Take twice your dose of Demadex tonight and tomorrow.  It is very important that you follow-up with your cardiologist. You should make an appointment for early this week Use Flonase nightly.  You may use the cough liquid as needed. Make sure to stay well-hydrated with water.  Return to the emergency room if you develop fevers, chills, worsening shortness of breath, difficulty breathing, chest pain, or any new or worsening symptoms.

## 2017-10-16 IMAGING — CR DG CHEST 2V
2 series · 2 of 2 positions shown · non-contrast
Comparison: None.

CLINICAL DATA: Cough, congestion for 2 weeks, productive cough.
History of heart valve surgery and stent placement.

EXAM:
CHEST  2 VIEW

[w chest pa]
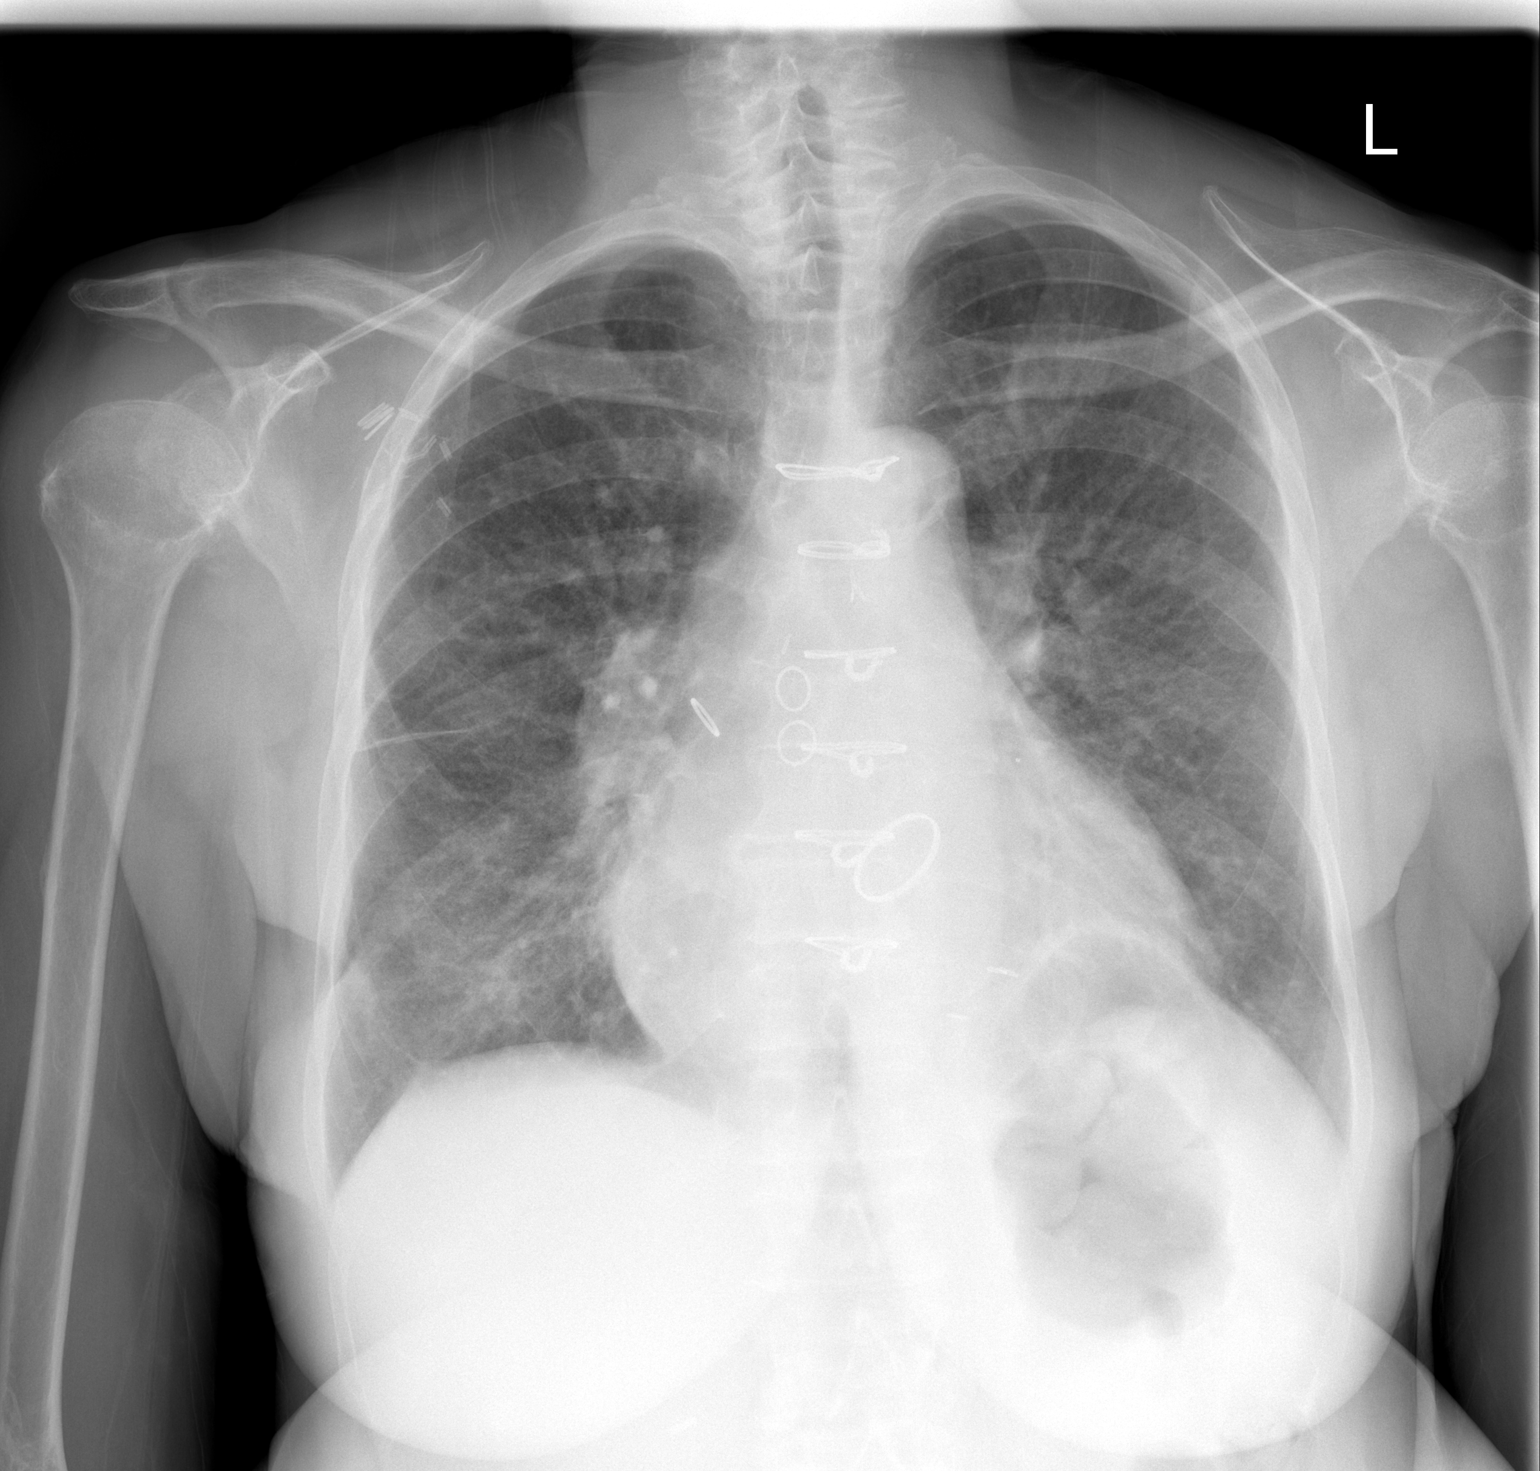

[w chest lat]
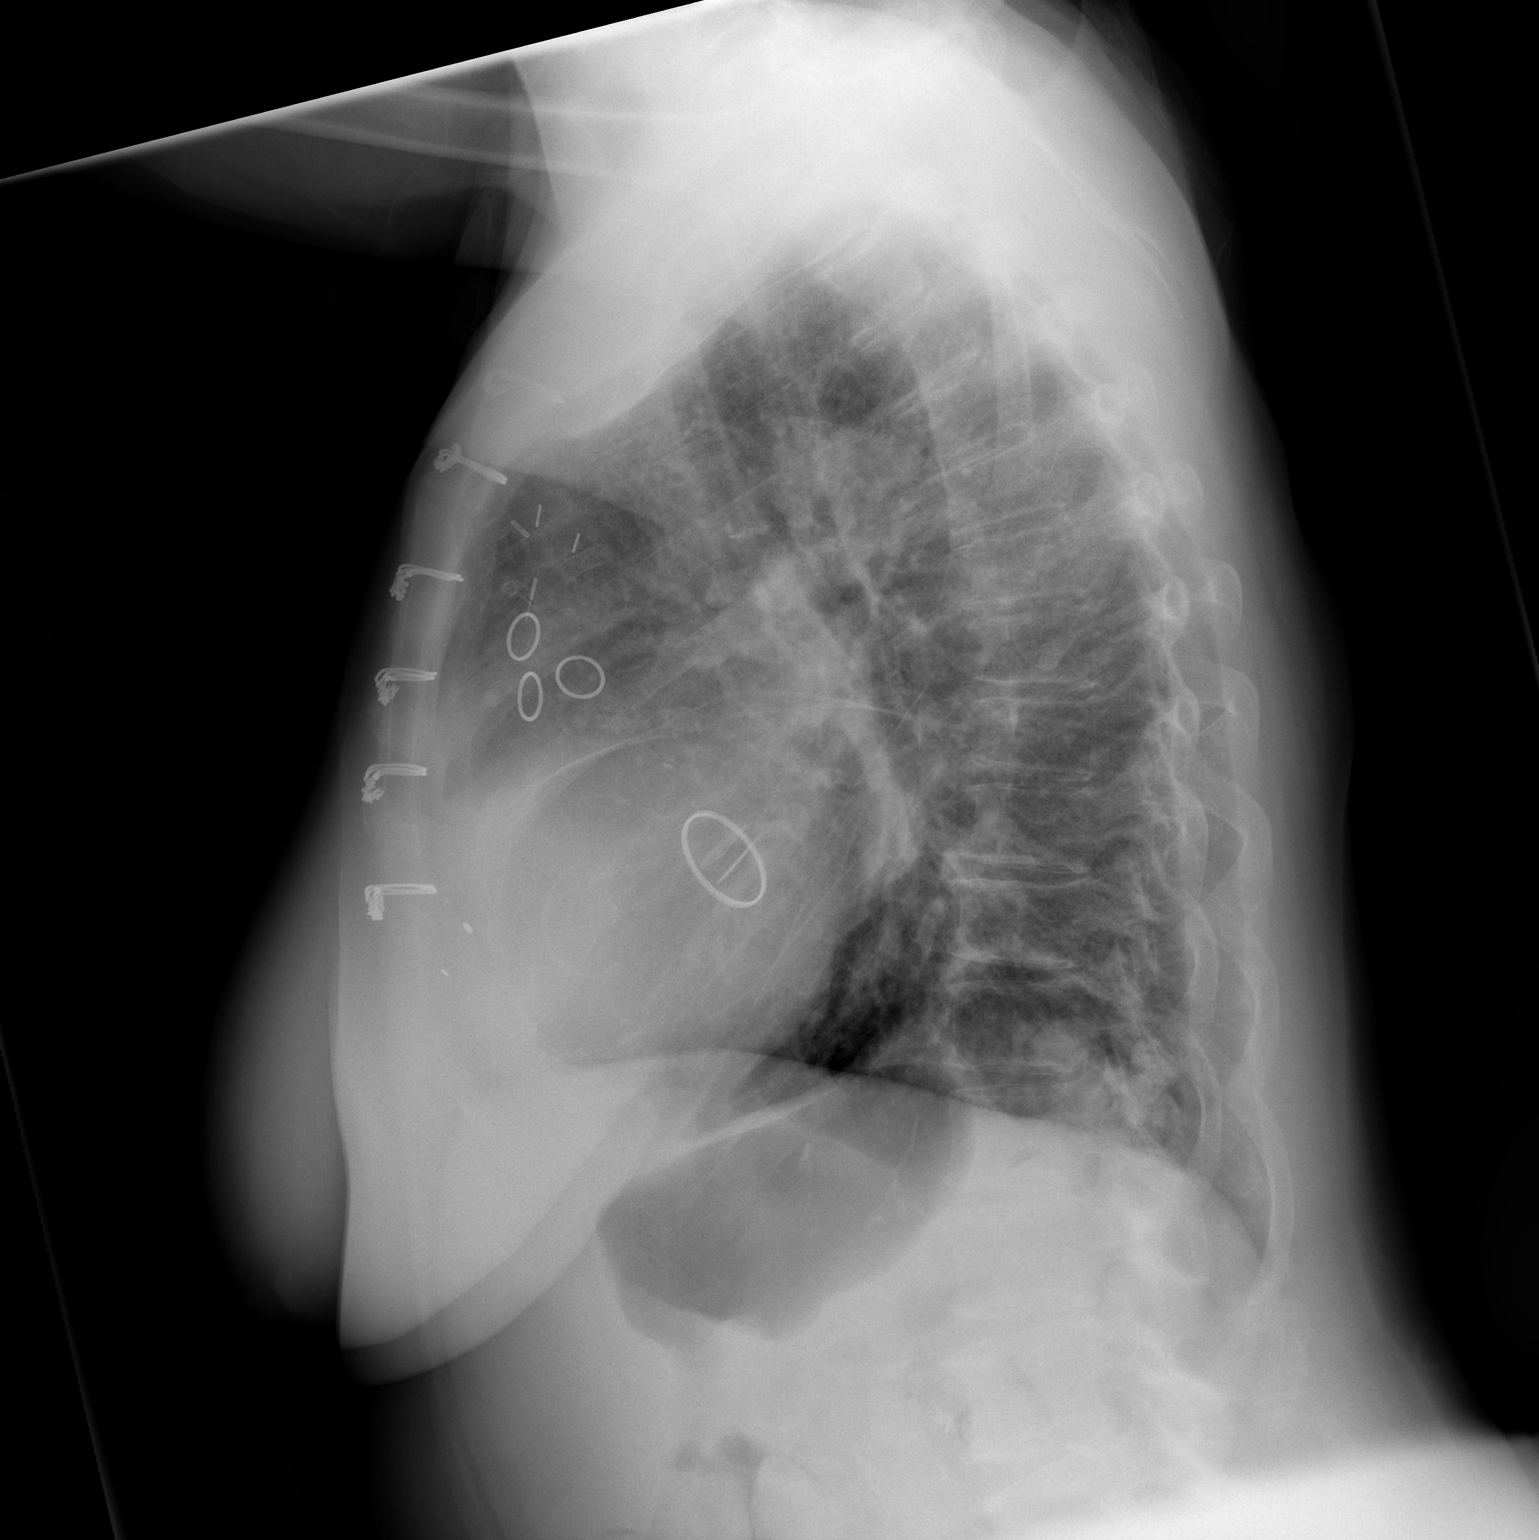

[2 of 2 positions shown; findings below may reference images not displayed]

FINDINGS: There is mild cardiomegaly. Median sternotomy wires appear intact
and normally aligned. Valve replacement hardware in place. There is
evidence of mild central pulmonary vascular congestion and mild
bilateral interstitial edema. No overt alveolar pulmonary edema.

Small pleural effusion versus atelectasis at the left lung base. No
large pleural effusion. No pneumothorax seen. No acute osseous
abnormality.
IMPRESSION: Mild cardiomegaly with central pulmonary vascular congestion and
mild interstitial edema suggesting mild CHF/volume overload. No
overt alveolar pulmonary edema seen.

Probable small left pleural effusion and/or atelectasis.

No evidence of pneumonia seen.

## 2019-08-05 IMAGING — CR DG CHEST 2V
2 series · 2 of 2 positions shown · non-contrast
Comparison: 01/25/2017

CLINICAL DATA: Cough, congestion for 1.5 weeks, worsening

EXAM:
CHEST  2 VIEW

[w chest pa]
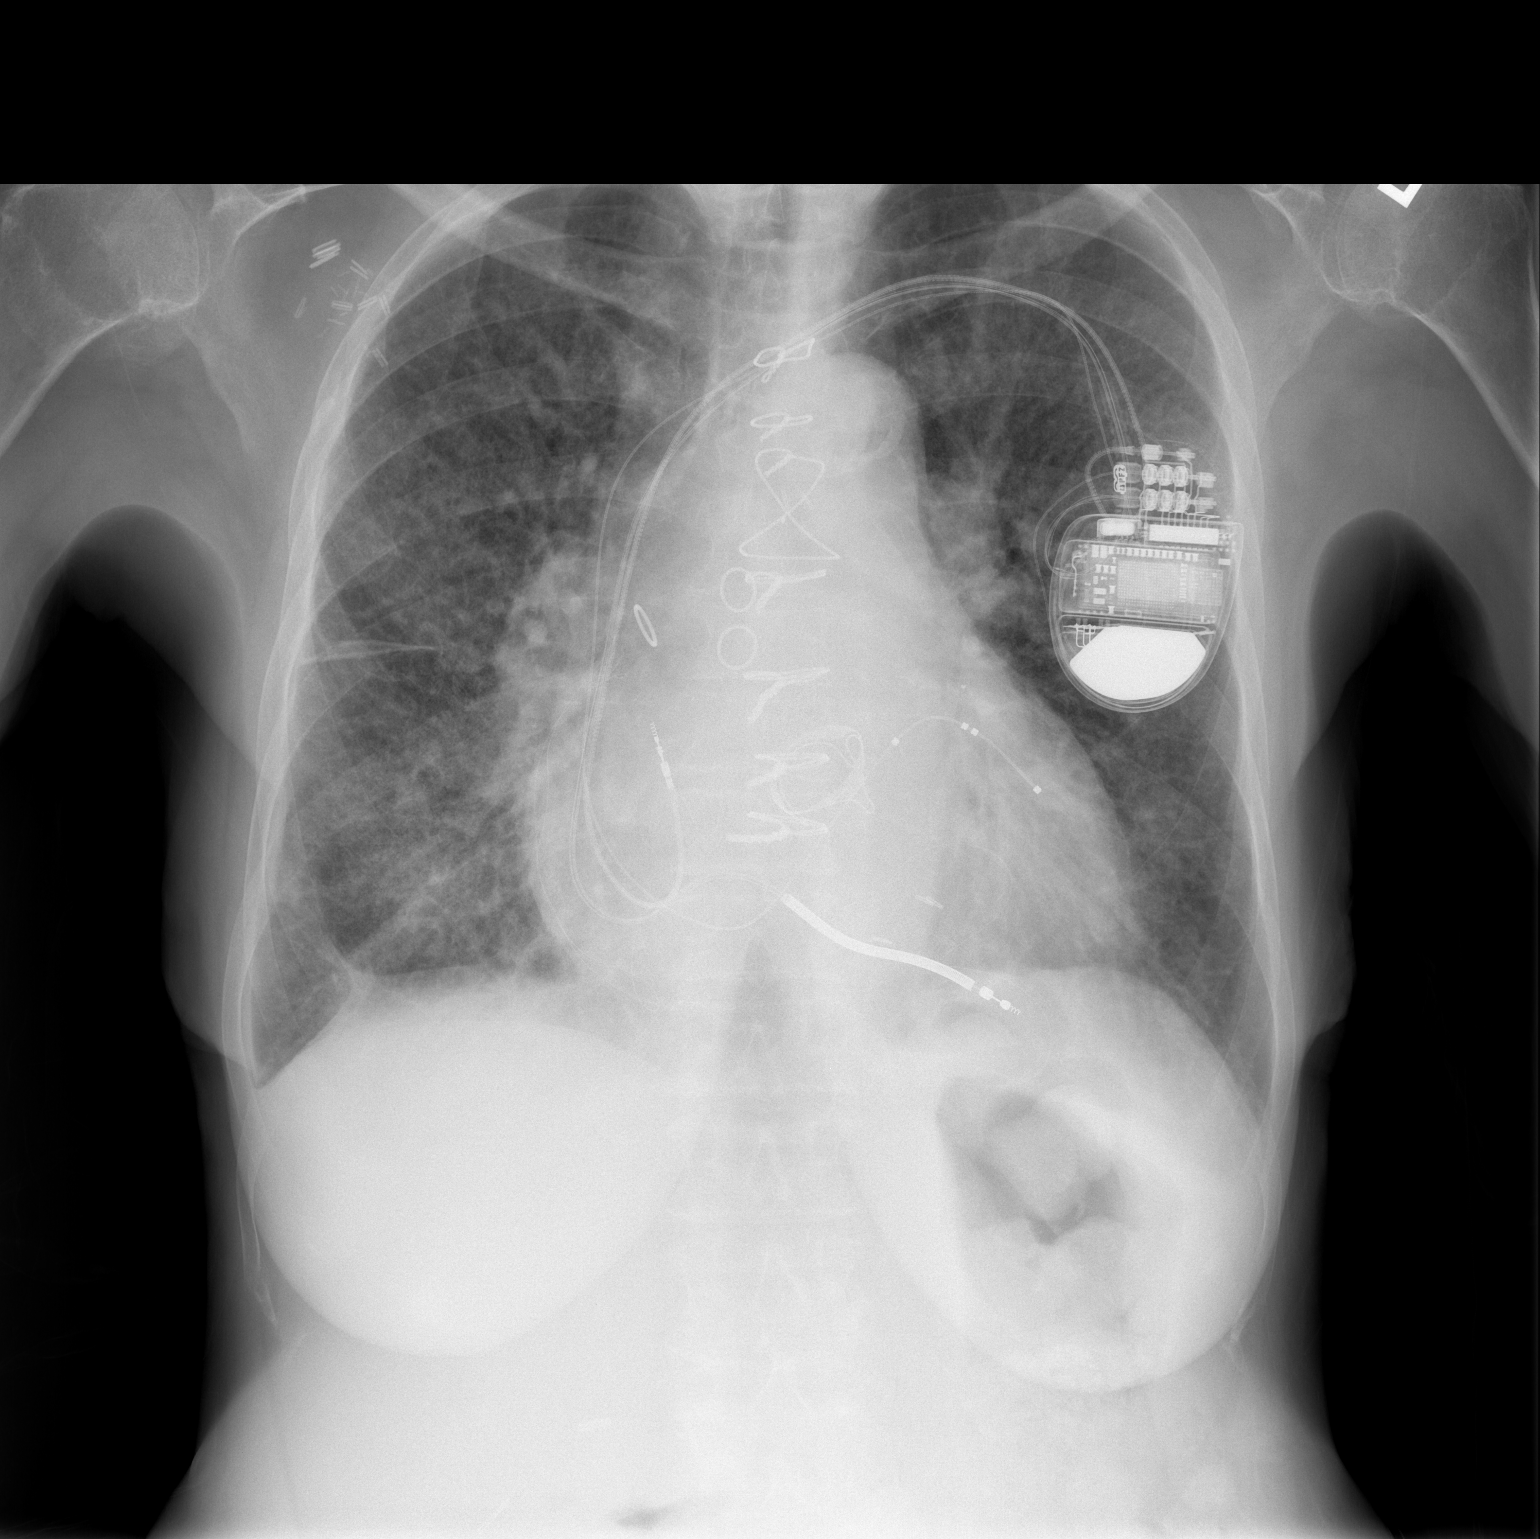

[w chest lat]
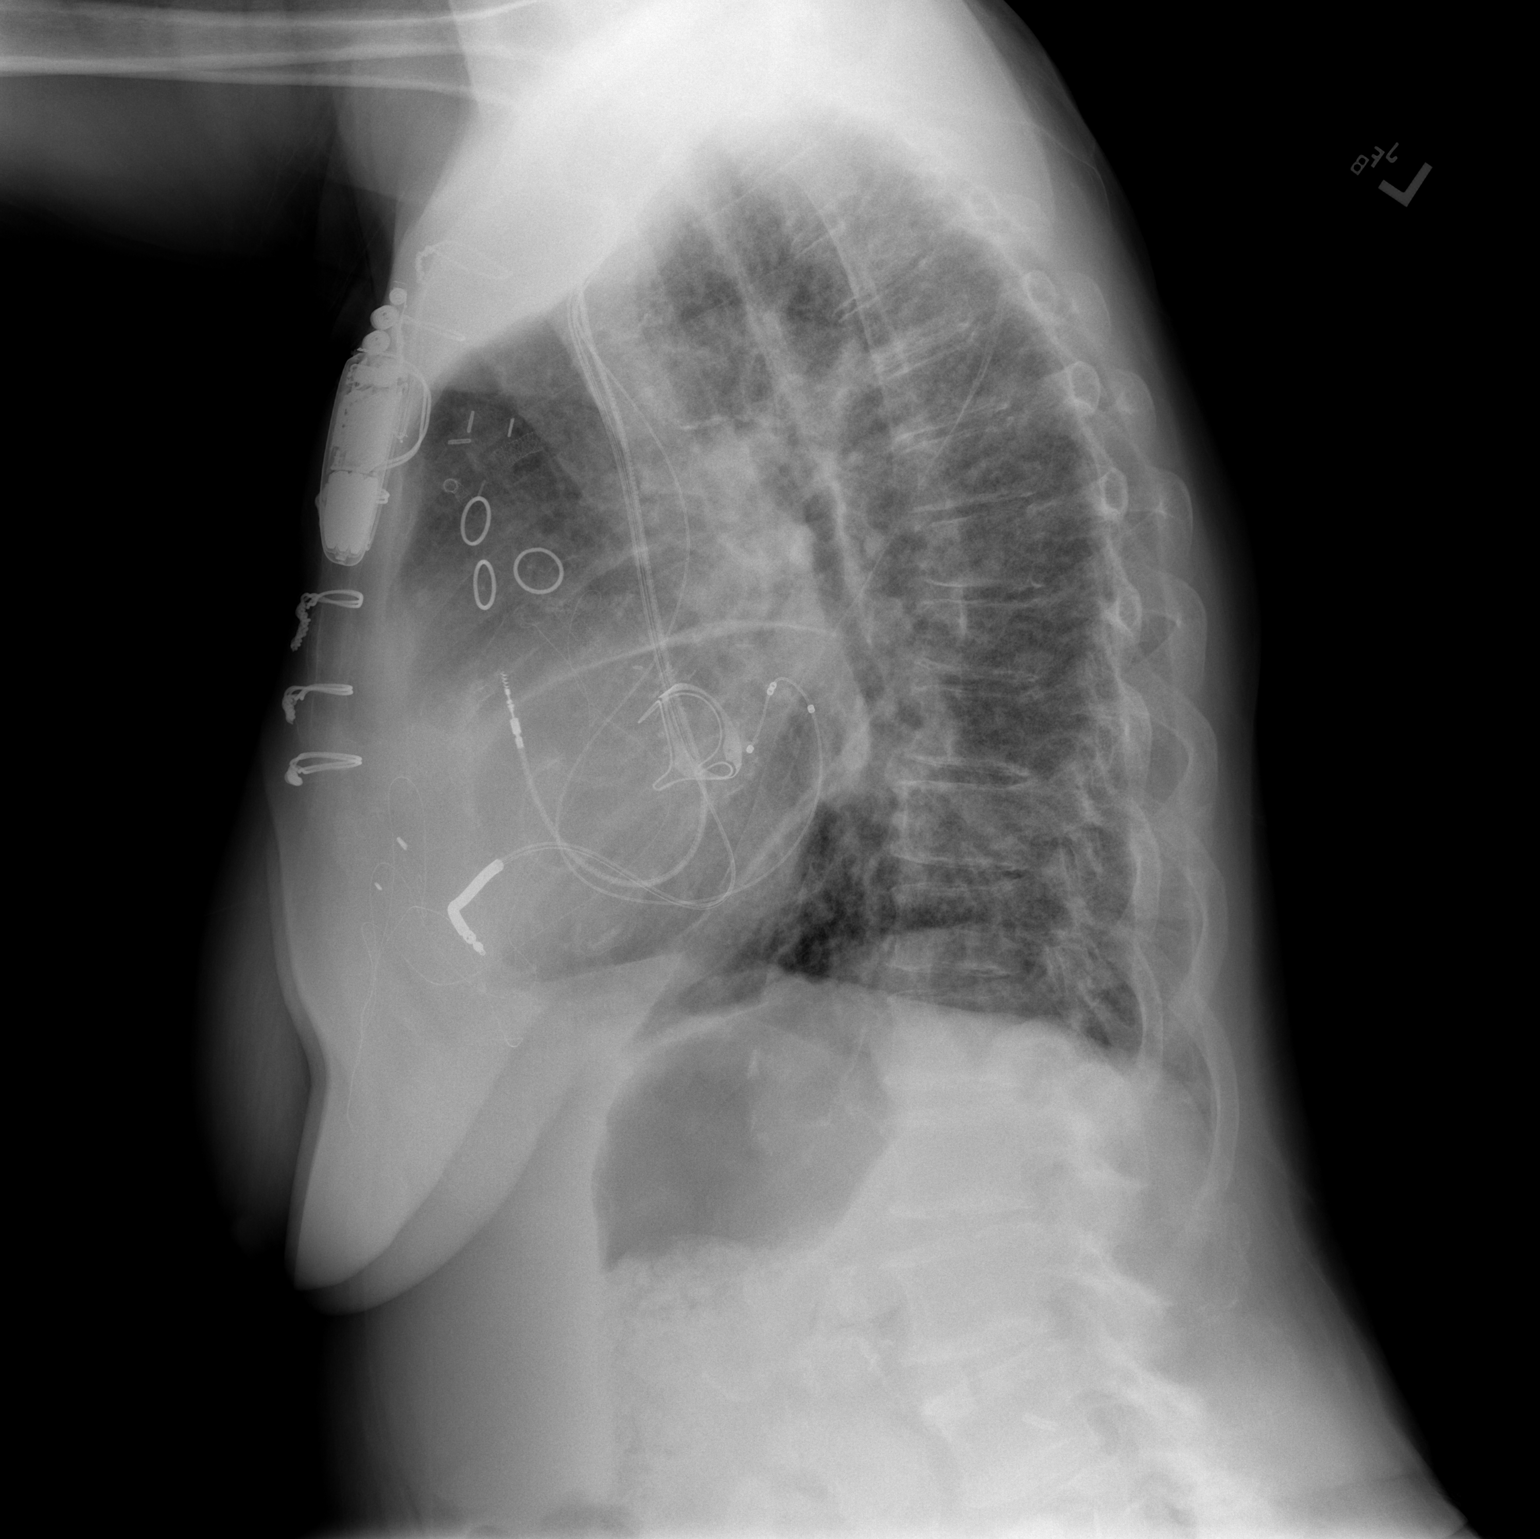

[2 of 2 positions shown; findings below may reference images not displayed]

FINDINGS: Diffuse bilateral interstitial thickening. No significant pleural
effusion. No pneumothorax. No focal consolidation. Stable
cardiomegaly. Prominence of the central pulmonary vasculature. Prior
CABG and aortic valve replacement. 4 lead cardiac pacemaker. No
acute osseous abnormality.
IMPRESSION: Mild CHF.

## 2020-08-09 ENCOUNTER — Other Ambulatory Visit: Payer: Self-pay

## 2020-08-09 ENCOUNTER — Encounter (HOSPITAL_BASED_OUTPATIENT_CLINIC_OR_DEPARTMENT_OTHER): Payer: Self-pay

## 2020-08-09 ENCOUNTER — Emergency Department (HOSPITAL_BASED_OUTPATIENT_CLINIC_OR_DEPARTMENT_OTHER): Payer: Medicare Other

## 2020-08-09 ENCOUNTER — Emergency Department (HOSPITAL_BASED_OUTPATIENT_CLINIC_OR_DEPARTMENT_OTHER)
Admission: EM | Admit: 2020-08-09 | Discharge: 2020-08-10 | Disposition: A | Discharge status: Home / Self Care | Payer: Medicare Other | Attending: Emergency Medicine | Admitting: Emergency Medicine

## 2020-08-09 DIAGNOSIS — R059 Cough, unspecified: Secondary | ICD-10-CM | POA: Diagnosis present

## 2020-08-09 DIAGNOSIS — Z951 Presence of aortocoronary bypass graft: Secondary | ICD-10-CM | POA: Insufficient documentation

## 2020-08-09 DIAGNOSIS — J440 Chronic obstructive pulmonary disease with acute lower respiratory infection: Secondary | ICD-10-CM | POA: Diagnosis not present

## 2020-08-09 DIAGNOSIS — F17210 Nicotine dependence, cigarettes, uncomplicated: Secondary | ICD-10-CM | POA: Diagnosis not present

## 2020-08-09 DIAGNOSIS — J209 Acute bronchitis, unspecified: Secondary | ICD-10-CM | POA: Insufficient documentation

## 2020-08-09 DIAGNOSIS — Z20822 Contact with and (suspected) exposure to covid-19: Secondary | ICD-10-CM | POA: Insufficient documentation

## 2020-08-09 DIAGNOSIS — Z79899 Other long term (current) drug therapy: Secondary | ICD-10-CM | POA: Diagnosis not present

## 2020-08-09 DIAGNOSIS — I2510 Atherosclerotic heart disease of native coronary artery without angina pectoris: Secondary | ICD-10-CM | POA: Insufficient documentation

## 2020-08-09 DIAGNOSIS — Z7982 Long term (current) use of aspirin: Secondary | ICD-10-CM | POA: Diagnosis not present

## 2020-08-09 DIAGNOSIS — I10 Essential (primary) hypertension: Secondary | ICD-10-CM | POA: Insufficient documentation

## 2020-08-09 LAB — CBC WITH DIFFERENTIAL/PLATELET
Abs Immature Granulocytes: 0.03 10*3/uL (ref 0.00–0.07)
Basophils Absolute: 0 10*3/uL (ref 0.0–0.1)
Basophils Relative: 0 %
Eosinophils Absolute: 0 10*3/uL (ref 0.0–0.5)
Eosinophils Relative: 1 %
HCT: 37.6 % (ref 36.0–46.0)
Hemoglobin: 11.9 g/dL — ABNORMAL LOW (ref 12.0–15.0)
Immature Granulocytes: 0 %
Lymphocytes Relative: 14 %
Lymphs Abs: 1.1 10*3/uL (ref 0.7–4.0)
MCH: 23.5 pg — ABNORMAL LOW (ref 26.0–34.0)
MCHC: 31.6 g/dL (ref 30.0–36.0)
MCV: 74.3 fL — ABNORMAL LOW (ref 80.0–100.0)
Monocytes Absolute: 0.6 10*3/uL (ref 0.1–1.0)
Monocytes Relative: 8 %
Neutro Abs: 6 10*3/uL (ref 1.7–7.7)
Neutrophils Relative %: 77 %
Platelets: 199 10*3/uL (ref 150–400)
RBC: 5.06 MIL/uL (ref 3.87–5.11)
RDW: 16.7 % — ABNORMAL HIGH (ref 11.5–15.5)
WBC: 7.8 10*3/uL (ref 4.0–10.5)
nRBC: 0 % (ref 0.0–0.2)

## 2020-08-09 LAB — BASIC METABOLIC PANEL
Anion gap: 10 (ref 5–15)
BUN: 14 mg/dL (ref 8–23)
CO2: 23 mmol/L (ref 22–32)
Calcium: 8.8 mg/dL — ABNORMAL LOW (ref 8.9–10.3)
Chloride: 107 mmol/L (ref 98–111)
Creatinine, Ser: 1.14 mg/dL — ABNORMAL HIGH (ref 0.44–1.00)
GFR, Estimated: 53 mL/min — ABNORMAL LOW (ref >60)
Glucose, Bld: 131 mg/dL — ABNORMAL HIGH (ref 70–99)
Potassium: 4.1 mmol/L (ref 3.5–5.1)
Sodium: 140 mmol/L (ref 135–145)

## 2020-08-09 LAB — BRAIN NATRIURETIC PEPTIDE: B Natriuretic Peptide: 955.6 pg/mL — ABNORMAL HIGH (ref 0.0–100.0)

## 2020-08-09 LAB — RESPIRATORY PANEL BY RT PCR (FLU A&B, COVID)
Influenza A by PCR: NEGATIVE
Influenza B by PCR: NEGATIVE
SARS Coronavirus 2 by RT PCR: NEGATIVE

## 2020-08-09 NOTE — ED Notes (Signed)
RT switched pt to new [place] and new O2 tank

## 2020-08-09 NOTE — ED Triage Notes (Addendum)
Pt c/o prod cough x 3 days-states she feels like she has PNA-pt wearing O2 2L [place]-states she wears 24/7-NAD-to triage in w/c

## 2020-08-09 NOTE — ED Notes (Signed)
Pt placed in a gown and on the monitor.

## 2020-08-10 ENCOUNTER — Other Ambulatory Visit: Payer: Self-pay

## 2020-08-10 DIAGNOSIS — J440 Chronic obstructive pulmonary disease with acute lower respiratory infection: Secondary | ICD-10-CM | POA: Diagnosis not present

## 2020-08-10 LAB — PROTIME-INR
INR: 2.7 — ABNORMAL HIGH (ref 0.8–1.2)
Prothrombin Time: 27.5 seconds — ABNORMAL HIGH (ref 11.4–15.2)

## 2020-08-10 MED ORDER — HYDROCOD POLST-CPM POLST ER 10-8 MG/5ML PO SUER: 5.0000 mL | Freq: Two times a day (BID) | ORAL | 0 refills | Status: AC | PRN

## 2020-08-10 MED ORDER — DOXYCYCLINE HYCLATE 100 MG PO CAPS: 100.0000 mg | ORAL_CAPSULE | Freq: Two times a day (BID) | ORAL | 0 refills | Status: AC

## 2020-08-10 MED ORDER — HYDROCOD POLST-CPM POLST ER 10-8 MG/5ML PO SUER
5.0000 mL | Freq: Once | ORAL | Status: AC
  Administered 2020-08-10: 5 mL via ORAL
  Filled 2020-08-10: qty 5

## 2020-08-10 MED ORDER — DOXYCYCLINE HYCLATE 100 MG PO TABS
100.0000 mg | ORAL_TABLET | Freq: Once | ORAL | Status: AC
  Administered 2020-08-10: 100 mg via ORAL
  Filled 2020-08-10: qty 1

## 2020-08-10 MED ORDER — FUROSEMIDE 10 MG/ML IJ SOLN
20.0000 mg | Freq: Once | INTRAMUSCULAR | Status: AC
  Administered 2020-08-10: 20 mg via INTRAVENOUS
  Filled 2020-08-10: qty 2

## 2020-08-10 NOTE — ED Notes (Signed)
69yo female with hx of COPD on home oxygen at 2lpm, states she has had a prod cough for the past 3 days of yellow thick sputum, denies any fever, states appetite is good, still active as tolerated. States she has had pneumonia before. Has some lower back aches as well

## 2020-08-10 NOTE — ED Provider Notes (Signed)
[facility] Provider Note: Lowella DellJ. Lane Chinita Schimpf, MD, FACEP  CSN: [id number] MRN: [id number] ARRIVAL: 08/09/20 at 1927 ROOM: [facility]07/[facility]07   CHIEF COMPLAINT  Cough   HISTORY OF PRESENT ILLNESS  08/10/20 12:53 AM Lisa Arias is a 69 y.o. female with known cardiomegaly status post defibrillators placement on chronic oxygen around-the-clock.  She only takes Lasix as needed.  She is here with 3 days of coughing which she describes as productive of thick green sputum.  She has not had a fever, sore throat, chest pain, loss of taste or smell, nausea, vomiting or diarrhea.  She has having back pain which she rates as an 8 out of 10, worse with movement, but this is not new.   Past Medical History:  Diagnosis Date  . Acid reflux   . Arthritis   . COPD (chronic obstructive pulmonary disease) (HCC)   . Coronary artery disease   . Heart attack (HCC)   . High cholesterol   . Hypertension     Past Surgical History:  Procedure Laterality Date  . ABDOMINAL HYSTERECTOMY    . CARDIAC DEFIBRILLATOR PLACEMENT    . CARDIAC SURGERY    . CORONARY ARTERY BYPASS GRAFT    . FEMUR FRACTURE SURGERY    . VALVE REPLACEMENT      No family history on file.  Social History   Tobacco Use  . Smoking status: Current Every Day Smoker    Types: Cigarettes  . Smokeless tobacco: Never Used  Vaping Use  . Vaping Use: Never used  Substance Use Topics  . Alcohol use: No  . Drug use: No    Prior to Admission medications   Medication Sig Start Date End Date Taking? Authorizing Provider  amiodarone (PACERONE) 200 MG tablet Take 200 mg by mouth daily.    [provider]  amoxicillin-clavulanate (AUGMENTIN) 875-125 MG tablet Take 1 tablet by mouth 2 (two) times daily. One po bid x 7 days 09/18/15   Rolan BuccoBelfi, Melanie, MD  aspirin EC 81 MG tablet Take 81 mg by mouth daily.    [provider]  atorvastatin (LIPITOR) 80 MG tablet Take 80 mg by mouth daily.    [provider]    carvedilol (COREG) 12.5 MG tablet Take 6.25-12.5 mg by mouth 2 (two) times daily with a meal. Patient takes 1 tablet in the morning and 1/2 tablet in the evening    [provider]  cloNIDine (CATAPRES) 0.2 MG tablet Take 0.5 tablets (0.1 mg total) by mouth 3 (three) times daily as needed (withdrawl symptoms). 10/24/11 10/23/12  Azalia Bilisampos, Kevin, MD  cyclobenzaprine (FLEXERIL) 10 MG tablet Take 1 tablet (10 mg total) by mouth 2 (two) times daily as needed for muscle spasms. 08/22/14   Gwyneth SproutPlunkett, Whitney, MD  Dextromethorphan-Guaifenesin 5-100 MG/5ML LIQD Take 10 mLs by mouth at bedtime as needed. 07/07/17   Caccavale, Sophia, PA-C  ezetimibe (ZETIA) 10 MG tablet Take 10 mg by mouth daily.    [provider]  fish oil-omega-3 fatty acids 1000 MG capsule Take 1 g by mouth daily.    [provider]  fluticasone (FLONASE) 50 MCG/ACT nasal spray Place 1 spray into both nostrils daily. 07/07/17   Caccavale, Sophia, PA-C  folic acid (FOLVITE) 1 MG tablet Take 1 mg by mouth daily.    [provider]  HYDROcodone-acetaminophen (NORCO/VICODIN) 5-325 MG per tablet Take 1-2 tablets by mouth every 6 (six) hours as needed for moderate pain or severe pain. 08/22/14   Gwyneth SproutPlunkett, Whitney, MD  isosorbide mononitrate (IMDUR) 30 MG 24 hr tablet Take 30 mg by mouth daily.    [provider]  Multiple Vitamin (MULITIVITAMIN WITH MINERALS) TABS Take 1 tablet by mouth daily.    [provider]  olmesartan-hydrochlorothiazide (BENICAR HCT) 40-25 MG per tablet Take 1 tablet by mouth daily.    [provider]  pantoprazole (PROTONIX) 20 MG tablet Take 20 mg by mouth daily.    [provider]  prasugrel (EFFIENT) 10 MG TABS Take 10 mg by mouth daily.    [provider]  pravastatin (PRAVACHOL) 40 MG tablet Take 40 mg by mouth daily.    [provider]  RABEprazole (ACIPHEX) 20 MG tablet Take 20 mg by mouth daily.    [provider]   rosuvastatin (CRESTOR) 20 MG tablet Take 20 mg by mouth daily.    [provider]  warfarin (COUMADIN) 10 MG tablet Take 10 mg by mouth daily. tue and sat 10 mg, other days 5 mg    [provider]  warfarin (COUMADIN) 6 MG tablet Take 6 mg by mouth daily.    [provider]  zolpidem (AMBIEN) 5 MG tablet Take 1 tablet (5 mg total) by mouth at bedtime as needed for sleep. 10/24/11 10/23/12  Azalia Bilis, MD    Allergies Codeine   REVIEW OF SYSTEMS  Negative except as noted here or in the History of Present Illness.   PHYSICAL EXAMINATION  Initial Vital Signs Blood pressure (!) 158/62, pulse 64, temperature 98.7 F (37.1 C), temperature source Oral, resp. rate 18, SpO2 100 %.  Examination General: Well-developed, cachectic female in no acute distress; appearance consistent with age of record HENT: normocephalic; atraumatic Eyes: pupils equal, round and reactive to light; extraocular muscles intact; slight arcus senilis bilaterally Neck: supple Heart: regular rate and rhythm Lungs: clear to auscultation bilaterally; rattly cough Abdomen: soft; nondistended; nontender; bowel sounds present Extremities: No deformity; full range of motion; pulses normal Neurologic: Awake, alert and oriented; motor function intact in all extremities and symmetric; no facial droop Skin: Warm and dry Psychiatric: Normal mood and affect   RESULTS  Summary of this visit's results, reviewed and interpreted by myself:   EKG Interpretation  Date/Time:  Tuesday August 10 2020 02:24:03 EST Ventricular Rate:  65 PR Interval:    QRS Duration: 150 QT Interval:  478 QTC Calculation: 498 R Axis:   -107 Text Interpretation: indeterminate rhythm Left bundle branch block No previous ECGs available Premature ventricular complexes Confirmed by Dajah Fischman, Jonny Ruiz ([id number]) on 08/10/2020 2:34:39 AM      Laboratory Studies: Results for orders placed or performed during the hospital encounter  of 08/09/20 (from the past 24 hour(s))  CBC with Differential     Status: Abnormal   Collection Time: 08/09/20  9:20 PM  Result Value Ref Range   WBC 7.8 4.0 - 10.5 K/uL   RBC 5.06 3.87 - 5.11 MIL/uL   Hemoglobin 11.9 (L) 12.0 - 15.0 g/dL   HCT 38.9 36 - 46 %   MCV 74.3 (L) 80.0 - 100.0 fL   MCH 23.5 (L) 26.0 - 34.0 pg   MCHC 31.6 30.0 - 36.0 g/dL   RDW 37.3 (H) 42.8 - 76.8 %   Platelets 199 150 - 400 K/uL   nRBC 0.0 0.0 - 0.2 %   Neutrophils Relative % 77 %   Neutro Abs 6.0 1.7 - 7.7 K/uL   Lymphocytes Relative 14 %   Lymphs Abs 1.1 0.7 - 4.0 K/uL  Monocytes Relative 8 %   Monocytes Absolute 0.6 0.1 - 1.0 K/uL   Eosinophils Relative 1 %   Eosinophils Absolute 0.0 0.0 - 0.5 K/uL   Basophils Relative 0 %   Basophils Absolute 0.0 0.0 - 0.1 K/uL   Immature Granulocytes 0 %   Abs Immature Granulocytes 0.03 0.00 - 0.07 K/uL  Basic metabolic panel     Status: Abnormal   Collection Time: 08/09/20  9:20 PM  Result Value Ref Range   Sodium 140 135 - 145 mmol/L   Potassium 4.1 3.5 - 5.1 mmol/L   Chloride 107 98 - 111 mmol/L   CO2 23 22 - 32 mmol/L   Glucose, Bld 131 (H) 70 - 99 mg/dL   BUN 14 8 - 23 mg/dL   Creatinine, Ser 0.86 (H) 0.44 - 1.00 mg/dL   Calcium 8.8 (L) 8.9 - 10.3 mg/dL   GFR, Estimated 53 (L) >60 mL/min   Anion gap 10 5 - 15  Brain natriuretic peptide     Status: Abnormal   Collection Time: 08/09/20  9:20 PM  Result Value Ref Range   B Natriuretic Peptide 955.6 (H) 0.0 - 100.0 pg/mL  Respiratory Panel by RT PCR (Flu A&B, Covid) - Nasopharyngeal Swab     Status: None   Collection Time: 08/09/20  9:20 PM   Specimen: Nasopharyngeal Swab  Result Value Ref Range   SARS Coronavirus 2 by RT PCR NEGATIVE NEGATIVE   Influenza A by PCR NEGATIVE NEGATIVE   Influenza B by PCR NEGATIVE NEGATIVE  Protime-INR     Status: Abnormal   Collection Time: 08/10/20  1:47 AM  Result Value Ref Range   Prothrombin Time 27.5 (H) 11.4 - 15.2 seconds   INR 2.7 (H) 0.8 - 1.2   Imaging  Studies: DG Chest Portable 1 View  Result Date: 08/09/2020 CLINICAL DATA:  69 year old female with productive cough. EXAM: PORTABLE CHEST 1 VIEW COMPARISON:  Chest radiograph and CT dated 01/07/2018. FINDINGS: There is cardiomegaly with vascular congestion and edema relatively similar to prior radiograph. Pneumonia is not excluded. Small left and probable right pleural effusions. No pneumothorax. Median sternotomy wires and mechanical cardiac valve. Left pectoral AICD device. Atherosclerotic calcification of the aorta. No acute osseous pathology. IMPRESSION: Cardiomegaly with findings of CHF and small bilateral pleural effusions. Pneumonia is not excluded. Electronically Signed   By: Elgie Collard M.D.   On: 08/09/2020 20:07    ED COURSE and MDM  Nursing notes, initial and subsequent vitals signs, including pulse oximetry, reviewed and interpreted by myself.  Vitals:   08/09/20 1949 08/09/20 2332 08/10/20 0011 08/10/20 0258  BP: (!) 163/97 (!) 151/86 (!) 158/62 (!) 158/60  Pulse: 80 68 64 61  Resp: 20 16 18 20   Temp: 98.1 F (36.7 C) 98.7 F (37.1 C)  98.9 F (37.2 C)  TempSrc: Oral Oral  Oral  SpO2: 99% (!) 2% 100% 99%   Medications  furosemide (LASIX) injection 20 mg (has no administration in time range)  doxycycline (VIBRA-TABS) tablet 100 mg (has no administration in time range)   3:26 AM Patient's presentation is consistent with acute bronchitis given the thick green sputum and cough for 3 days.  She is negative for Covid.  No evidence of pneumonia on chest x-ray but her significant cardiomegaly could be obscuring a pneumonia.  We will treat with doxycycline.  We will also give a dose of Lasix prior to discharge given radiographic evidence of CHF.   PROCEDURES  Procedures   ED  DIAGNOSES     ICD-10-CM   1. Acute bronchitis with COPD (HCC)  J44.0    J20.9        Yuriko Portales, MD 08/10/20 0330

## 2022-09-07 IMAGING — DX DG CHEST 1V PORT
1 series · 1 of 1 positions shown · non-contrast
Comparison: Chest radiograph and CT dated 01/07/2018.

CLINICAL DATA: 68-year-old female with productive cough.

EXAM:
PORTABLE CHEST 1 VIEW

[chest ap]
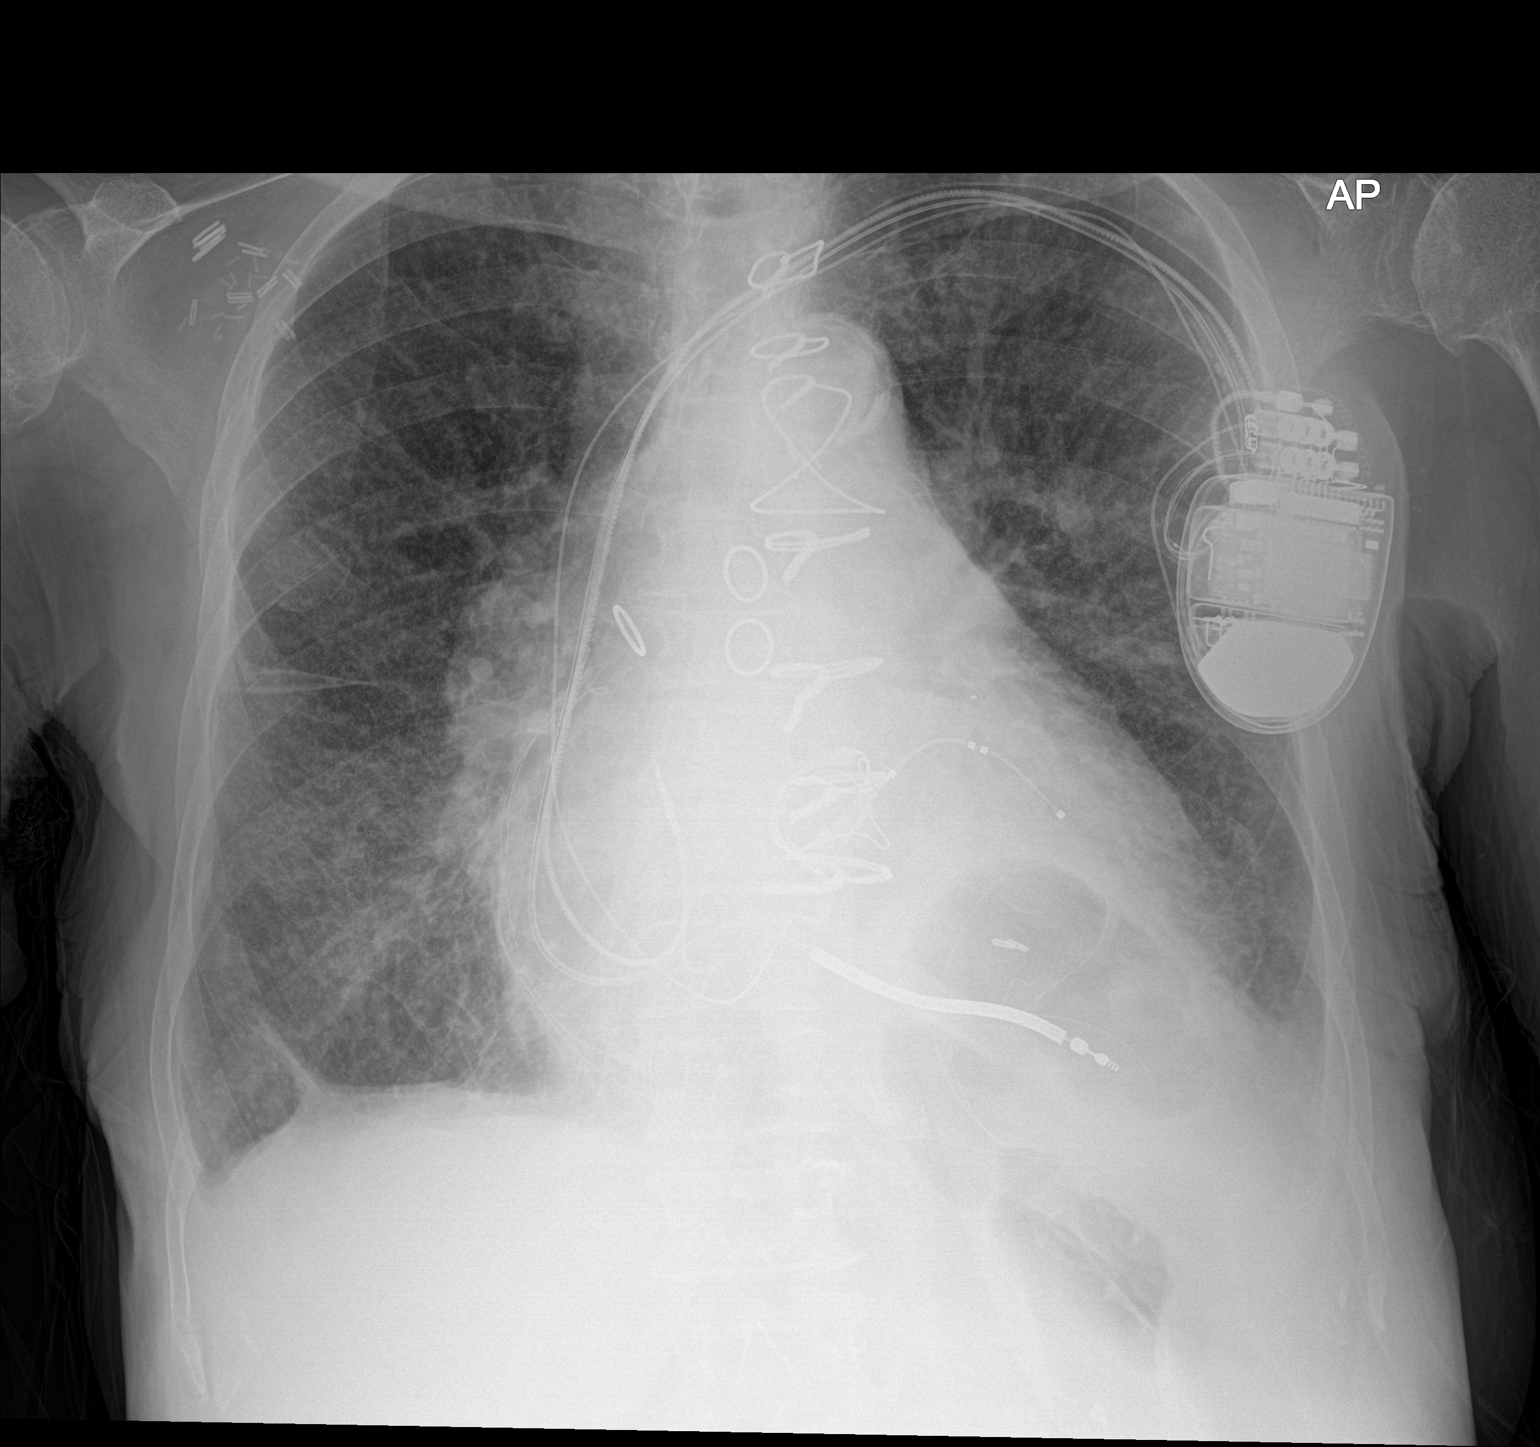

[1 of 1 positions shown; findings below may reference images not displayed]

FINDINGS: There is cardiomegaly with vascular congestion and edema relatively
similar to prior radiograph. Pneumonia is not excluded. Small left
and probable right pleural effusions. No pneumothorax. Median
sternotomy wires and mechanical cardiac valve. Left pectoral AICD
device. Atherosclerotic calcification of the aorta. No acute osseous
pathology.
IMPRESSION: Cardiomegaly with findings of CHF and small bilateral pleural
effusions. Pneumonia is not excluded.

## 2024-08-02 DEATH — deceased
